# Patient Record
Sex: Male | Born: 1960 | Race: White | Hispanic: No | Marital: Married
Health system: Midwestern US, Community
[De-identification: ages and names within clinical notes are randomized; demographics above are authoritative.]

## PROBLEM LIST (undated history)

## (undated) DIAGNOSIS — M19012 Primary osteoarthritis, left shoulder: Secondary | ICD-10-CM

## (undated) DIAGNOSIS — Z125 Encounter for screening for malignant neoplasm of prostate: Secondary | ICD-10-CM

## (undated) DIAGNOSIS — Z114 Encounter for screening for human immunodeficiency virus [HIV]: Secondary | ICD-10-CM

## (undated) DIAGNOSIS — K429 Umbilical hernia without obstruction or gangrene: Secondary | ICD-10-CM

## (undated) DIAGNOSIS — E782 Mixed hyperlipidemia: Principal | ICD-10-CM

## (undated) DIAGNOSIS — E039 Hypothyroidism, unspecified: Principal | ICD-10-CM

## (undated) DIAGNOSIS — C61 Malignant neoplasm of prostate: Principal | ICD-10-CM

## (undated) DIAGNOSIS — I10 Essential (primary) hypertension: Principal | ICD-10-CM

---

## 2009-10-14 MED FILL — ONDANSETRON (PF) 4 MG/2 ML INJECTION: 4 mg/2 mL | INTRAMUSCULAR | Qty: 2

## 2009-10-14 MED FILL — FENTANYL CITRATE (PF) 50 MCG/ML IJ SOLN: 50 mcg/mL | INTRAMUSCULAR | Qty: 2

## 2011-12-01 NOTE — Progress Notes (Signed)
Physical Therapy Evaluation and Plan of Care    Patient Name: Jay Lee  Date:12/01/2011  DOB: Jul 22, 1961  Payor: Payor: BLUE CROSS  [place]: [facility] [place] [place] [facility]  Product Type: PPO    Diagnosis: Shoulder pain [719.41]  Illene Bolus, MD    Dictated 12/01/11 Reviewed 12/02/11     PHYSICAL THERAPY INITIAL EVALUATION    SUBJECTIVE:  This is a 51 year old male with a chief complaint of right elbow pain, stiffness, and weakness.  He reports slipping falling on an outstretched elbow in middle of February.  He reports he underwent a surgical repair ???of ligaments??? and bone removal approximately two weeks ago.  He neglected to bring his referral from the surgeon.    CURRENT SYMPTOMS:  He complains of discomfort around the elbow, which can disturb his sleep at night.  He complains of limited extension of the elbow and relative weakness.    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  He has returned to full duty work with some adjustments.  He does ask for help lifting tyres.  He has returned to operating and pushing a power tool.    PREVIOUS MEDICAL HISTORY:  He fractured his right ulna when he was 51 years old and this was treated with open reduction and internal fixation and a tendon transfer.    He had right total knee replacement in 2006.  He reports he is in otherwise good health.  His hobby is racing stock cars.    OBJECTIVE:  Posture in relaxed stance, he presents with good muscular development to the cervical spine, scapular region, and arms.  In relaxed stance, the right arm appears shorter than the left.  There is a surgical scar on the posterior aspect of the right elbow.  There is increased skin temperature about the surgery, no skin discoloration, nor obvious signs of infection, and mild amount of swelling.    Active elbow flexion is within normal limits, symmetric, and symptom free.  Active pronation and supination is within normal limits, symmetric, and symptom free.  Active and passive  right elbow extension is approximately minus 5 degrees.  Passive right elbow extension, the patient reports limitation of movement from the dorsal aspect of the elbow and stretching sensation in the volar aspects.    Active stretch of the right triceps (shoulder) flexion and elbow flexion is mildly limited on the right in comparison to the left.    TREATMENT:  Today, he was counseled to be more conservative and protective of the elbow.  He was encouraged to purchase an elbow sleeve or pad to wear while working as an Journalist, newspaper.  He was counseled regarding the necessary time for healing of the injured tissues that have been reconstructed.  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  Today, he was treated with local application of ice for 10 minutes.  He was encouraged to use local application of ice one to two times a day.  We discussed the risk of doing too much, too fast, and too soon.    ASSESSMENT:  The patient presents with right elbow pain, status post surgical procedure.  Clinical examination is significant for mild limitation of elbow extension and mild-to-moderate amount of possible joint effusion.    INITIAL WORKING HYPOTHESIS:  If he approaches his rehabilitation and recovery in a more conservative manner, we should be able to prevent disruption of the surgical repair and facilitate the  healing process.  After appropriate time for tissue repair, a progressive strengthening program for elbow extension is indicated.    INITIAL TREATMENT GOAL:  1. To decrease pain and decrease swelling about elbow within one to two weeks.  2. To demonstrate normal range of motion in strength for right elbow extension in four to eight weeks.    PLAN:  He will be seen once a week for the next three to four weeks for reassessment, additional instruction, self-management techniques, and progression of remedial exercises.    Alta Corning PT, DPT, OCS      PT License # [id number]    Compliance  data:  Treatment area: Shoulder pain [719.41]  Patient date of birth verified, yes  Medication log reviewed and reconciled completed  Known adverse and allergic reacations reviewed completed  Prior hospitalization: none  Prior level of function: see history below  Patient / Family readiness to learn indicated by: asking questions, trying to perform skills and interest  Persons(s) to be included in education:   patient (P)  Barriers to Learning/Limitations: no  Domestic abuse: no evidence  Fall risk assessment: no evidence  Suicide risk assessment: reviewed and assessed no evidence  Pertinent medical history, systems reviewed, and co-morbid conditions: see initial dictation  Community resources were discussed with patient if indicated not indicated  Initial functional outcome measures completed DASH  Skilled Physical Therapy indicated - yes  Patient/ Caregiver education and instruction: self care, activity modification and brace/ splint application  Rehabilitation Potential: excellent  Pain level pre-treatment: 5  Pain level post-treatment: 3  Patient goals: "get back normal"    In time:2:02 PM  Out time:2:45  Total Treatment Time: 43  Total Timed Codes: 20  Evaluation time: 18  Modality 15 minutes  Patient Self Care Home Management 20

## 2011-12-08 NOTE — Progress Notes (Signed)
PHYSICAL THERAPY DAILY TREATMENT NOTE    Patient Name: Jay Lee  Date:12/08/2011  DOB: 11-24-1960  [x]   Patient DOB Verified  Payor: Payor: BLUE CROSS  Plan: [facility] [place] [facility]  Product Type: PPO    Referral Source: Illene Bolus, MD    Treatment Area: Shoulder pain [719.41]    SUBJECTIVE  Pain Level (0-10 scale): 0  He reports he is pain free.  He reports he has been able to work and that he has been cautious.      Any medication changes, allergies to medications, adverse drug reactions, diagnosis change, or new procedure performed?: [x]  No    []  Yes     OBJECTIVE  Therapeutic Exercise:  Active and passive elbow flexion is WNL symmetric and symptom free.  Active and passive forearm pronation supination is with in normal limits and symptom free.      Passive stretch of right triceps (elbow flexion shoulder flexion) on right is relatively stiff compared to same maneuver on left.      Passive right elbow extension is grossly with in normal limits and symptom free on right.      Resisted isometric right elbow extension is weak and painful compared to left.  There is relative atrophy of right triceps compared to left.      He was instructed in light progressive resistive elbow extension (blue theraband) on right.      Manual Therapy:   The right surgical scar is closed.  There is mild adherence of surgical scar to underlying soft tissue.  The surgical scar was treated with soft tissue massage.      Pain Level (0-10 scale) post treatment: 0    ASSESSMENT  He is approximately 3 weeks post surgery, good range of motion.  There is relative weakness.  He has complied with recommended treatment.      PLAN  Await recommendation from surgeon whether additional PT sessions are indicated or not.    Alta Corning, PT, DPT, OC  PT License # [id number]    In time:2:58 PM  Out time:3:25  Total Treatment Time (min): 27  Total Timed Codes (min): 27  Time for Therapeutic Exercise: 18  Time for Manual Therapy: 9

## 2012-02-11 NOTE — Progress Notes (Deleted)
[facility]  [address], [place], [place] [address]  Phone: [phone]  Fax: [phone]    Discharge Summary    Name: Jay Lee   DOB: [birth year]   MD: Illene Bolus, MD       Treatment Diagnosis: Shoulder pain [719.41]  Start of Care: 12/01/11    Visits from Start of Care: 2  Missed Visits: 0    Summary of Care: Therapeutic exercise, Therapeutic activities, Physical agent/modality and Patient education    Assessment / Recommendations:   Discontinue therapy. Progressing towards or have reached established goals.    Alta Corning, PT PT License # [id number]  02/11/2012

## 2012-02-21 NOTE — Progress Notes (Signed)
PHYSICAL THERAPY DAILY TREATMENT NOTE    Patient Name: Jay Lee  Date:02/21/2012  DOB: 11-27-1960  [x]   Patient DOB Verified  Payor: Payor: BLUE CROSS  Plan: [facility] [place] [place] [facility]  Product Type: PPO    Referral Source: Illene Bolus, MD    Treatment Area: Shoulder pain [719.41]    SUBJECTIVE  Pain Level (0-10 scale): 0  Reports tripping in April hearing a pop and the elbow swelled.     Now c/o episodic catch sore spot on posterior aspect of elbow.      Patient self reported Quick Dash Functional Outcome score is 20% disability.  He notes difficulty throwing a football.  He has difficulty crawling and supporting himself with right arm.  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      Any medication changes, allergies to medications, adverse drug reactions, diagnosis change, or new procedure performed?: [x]  No    []  Yes     OBJECTIVE  Therapeutic Exercise:  Max grip strength is 82 lbs on left and 76 lbs on right, he is right hand dominant.      There is visible atrophy of right triceps compared to left.  He was able to perform multiple elbow extensions (triceps free weight lift) with 10 lbs on left, but could only handle 2.5 lbs on right.      He was instructed to begin daily triceps progressive strengthening with 2.5 lbs.      He was instructed to practice isometric elbow extension exercises frequently throughout the day.    Therapeutic Activity:   When in hands and knees position supporting himself with just the right am it elicited right elbow strain.      Pain Level (0-10 scale) post treatment: 0    ASSESSMENT  Three plus months s/p elbow reconstruction there is residual weakness in elbow extension on right.      The new treatment goal is to demonstrate improved strength in elbow extension in 3 to 4 weeks.      PLAN    Alta Corning, PT, DPT, OC  PT License # [id number]    In time:5 PM  Out time:5:30  Total Treatment Time (min): 30  Total Timed Codes (min): 30  Time for Therapeutic  Exercise: 30

## 2012-02-23 NOTE — Progress Notes (Signed)
PHYSICAL THERAPY DAILY TREATMENT NOTE    Patient Name: ULYSEES ROBARTS  Date:02/23/2012  DOB: 06-Dec-1960  [x]   Patient DOB Verified  Payor: Payor: BLUE CROSS  Plan: [facility]UE [place] [place] [facility]  Product Type: PPO    Referral Source: Illene Bolus, MD    Treatment Area: Shoulder pain [719.41]    SUBJECTIVE  Pain Level (0-10 scale): 0  He has occasional soreness    Any medication changes, allergies to medications, adverse drug reactions, diagnosis change, or new procedure performed?: [x]  No    []  Yes     OBJECTIVE  Therapeutic Exercise:  2.5 lbs 14 reps  5 lbs 7 reps 4 sets    Hands knees push ups 7 reps 5 sets      Pain Level (0-10 scale) post treatment: 0    ASSESSMENT  He is demonstrating improved strengthen    PLAN  Continue plan of care    Alta Corning, PT, DPT, OC  PT License # [id number]    In time:5:00 PM  Out time:5:15  Total Treatment Time (min): 15  Total Timed Codes (min): 15  Time for Therapeutic Exercise: 15

## 2012-03-20 NOTE — Progress Notes (Signed)
PHYSICAL THERAPY DAILY TREATMENT NOTE    Patient Name: Jay Lee  Date:03/20/2012  DOB: 25-Dec-1960  [x]   Patient DOB Verified  Payor: Payor: BLUE CROSS  Plan: [facility] [place] [place] [facility]  Product Type: PPO    Referral Source: Illene Bolus, MD    Treatment Area: Shoulder pain [719.41]    SUBJECTIVE  Pain Level (0-10 scale): 3  He is c/o increasing frequency of symptoms.  He describes a sensation that something is "sticking or tearing" on posterior aspect of the right elbow with certain movements.  He notices symptoms more with extending the elbow or pushing with the arm.  He notices the symptoms more when he tries to do remedial exercises of resistive elbow extension.  He stopped doing the remedial exercises about a week ago.      Any medication changes, allergies to medications, adverse drug reactions, diagnosis change, or new procedure performed?: [x]  No    []  Yes     OBJECTIVE  Therapeutic Exercise:  Active extension of right elbow is with in normal limits, mildly symptomatic at end range.  With active right elbow extension there appears to be greater swelling/puffiness in the ulnar humeral joint compared to the left.  Palpation of the right lateral tendonous insertion feels thinner and/or absent compared to the left.      Active elbow flexion and stretch of the right triceps is with in normal limits and symptom free.      He was instructed to discontinue strengthening exercises use local application of ice and return to surgeon for follow up.      Pain Level (0-10 scale) post treatment: 3    ASSESSMENT  Unfortunately, he is experiencing a adverse response to attempts to participate in more aggressive strengthening exercises.  Symptoms and signs suggest possible impairment of the musculotendinous insertion of the triceps.      PLAN  Hold therapy and remedial exercise until there is reassessment by surgeon.    Alta Corning, PT, DPT, OC  PT License # [id number]    In time:5:01 PM  Out time:5:15  Total  Treatment Time (min): 14  Total Timed Codes (min): 14  Time for Therapeutic Exercise: 14

## 2014-01-16 NOTE — ED Provider Notes (Signed)
HPI Comments: 53 y.o. male with past medical history significant for high cholesterol and GERD who presents from home via EMS with chief complaint of left eye pain. Pt reports he was working on a tire with a pry bar this evening and the handle hit his left eye. Pt states "next thing I know, I was on my knees". Pt also c/o "foggy" vision in both eyes. Pt denies any foreign body sensation.  There are no other acute medical concerns at this time.    Significant PSHx: multiple orthopedic surgeries  Significant FMHx: none discussed.   PCP: Rockne Coons K Crossen, MD    Note written by Roberto ScalesGloria Kubuanu, Scribe, as dictated by Christella Noaharles D Shauna Bodkins, MD 8:31 PM        The history is provided by the patient.        Past Medical History   Diagnosis Date   ??? High cholesterol    ??? GERD (gastroesophageal reflux disease)         Past Surgical History   Procedure Laterality Date   ??? Hx knee replacement Right    ??? Hx orthopaedic Right      FOREARM   ??? Hx orthopaedic Right      ELBOW         History reviewed. No pertinent family history.     History     Social History   ??? Marital Status: MARRIED     Spouse Name: N/A     Number of Children: N/A   ??? Years of Education: N/A     Occupational History   ??? Not on file.     Social History Main Topics   ??? Smoking status: Never Smoker    ??? Smokeless tobacco: Not on file   ??? Alcohol Use: Yes   ??? Drug Use: Not on file   ??? Sexual Activity: Not on file     Other Topics Concern   ??? Not on file     Social History Narrative   ??? No narrative on file                  ALLERGIES: Review of patient's allergies indicates no known allergies.      Review of Systems   Eyes: Positive for pain and visual disturbance.   All other systems reviewed and are negative.      Filed Vitals:    01/16/14 1958 01/16/14 2000   BP: 204/122 188/108   Temp: 98.4 ??F (36.9 ??C)    Resp: 22    Height: 5\' 8"  (1.727 m)    Weight: 90.719 kg (200 lb)    SpO2: 96%             Physical Exam   Constitutional: He appears well-developed and  well-nourished. No distress.   HENT:   Head: Normocephalic.   Eyes: Pupils are equal, round, and reactive to light.   Contusion on left medial sclera.   Small abrasion on left eyelid.   Anterior chambers clear with no hyphema.   No corneal injury with fluorescein slit lamp exam.   Neck: Normal range of motion.   Cardiovascular: Normal rate and regular rhythm.    No murmur heard.  Pulmonary/Chest: Effort normal and breath sounds normal. No respiratory distress.   Abdominal: Soft. There is no tenderness.   Musculoskeletal: Normal range of motion. He exhibits no edema.   Neurological: He is alert. He has normal strength. No cranial nerve deficit.   Skin: Skin is warm  and dry.   Psychiatric: He has a normal mood and affect. His behavior is normal.   Nursing note and vitals reviewed.  Note written by Roberto ScalesGloria Kubuanu, Scribe, as dictated by Christella Noaharles D Cecile Guevara, MD 8:59 PM       MDM    Procedures    CONSULT NOTE:  9:19 PM Christella Noaharles D Shanikia Kernodle, MD spoke with Dr. Yisroel Rammingarothers, Consult for Ophthalmology. Discussed available diagnostic tests and clinical findings. Provider is in agreement with care plans as outlined. Provider will see pt tomorrow afternoon.  Note written by Roberto ScalesGloria Kubuanu, Scribe, as dictated by Christella Noaharles D Kinsey Cowsert, MD 9:19 PM      Assessment and plan           the patient has a scleral contusion but no sign of corneal injury or hyphema. I spoke with Dr. Carole Binningaruthers who will see the patient tomorrow for further followup at [place]. The patient's visual acuity is 25/20 in both eyes.Christella Noaharles D Azael Ragain, MD 9:39 PM

## 2014-01-16 NOTE — ED Notes (Signed)
TRIAGE: Patient arrives from home, escorted by male companion, for LEFT eye injury. Patient has approximate 1/4" laceration to LEFT upper eyelid. Patient has redness to LEFT sclera. Patient states he was working on a tire with a crowbar when a screwdriver popped up and injured his eye. He denies any loss of consciousness.

## 2014-01-16 NOTE — ED Notes (Signed)
Pt given discharge instructions. Questions answered and pt states understanding, no distress noted, pt ambulated out of unit.

## 2014-01-17 MED ADMIN — tetracaine HCl (PF) (PONTOCAINE) 0.5 % ophthalmic solution 2 Drop: OPHTHALMIC | @ 01:00:00 | NDC 00065074112

## 2014-01-17 MED ADMIN — tobramycin (TOBREX) 0.3 % ophthalmic ointment: OPHTHALMIC | @ 02:00:00 | NDC 00065064435

## 2014-01-17 MED ADMIN — fluorescein (FUL-GLO) 1 mg ophthalmic strip 1 Strip: OPHTHALMIC | @ 01:00:00 | NDC 17478040401

## 2014-01-17 MED FILL — TOBREX 0.3 % EYE OINTMENT: 0.3 % | OPHTHALMIC | Qty: 3.5

## 2014-01-17 MED FILL — TETRACAINE HCL (PF) 0.5 % EYE DROPS: 0.5 % | OPHTHALMIC | Qty: 2

## 2014-01-17 MED FILL — BIOGLO 1 MG EYE STRIPS: 1 mg | OPHTHALMIC | Qty: 1

## 2019-01-31 ENCOUNTER — Encounter

## 2019-02-08 ENCOUNTER — Inpatient Hospital Stay: Admit: 2019-02-08 | Payer: BLUE CROSS/BLUE SHIELD | Attending: Orthopaedic Surgery | Primary: Internal Medicine

## 2019-02-08 DIAGNOSIS — M19012 Primary osteoarthritis, left shoulder: Secondary | ICD-10-CM

## 2019-02-21 IMAGING — DX XR KNEE 2 VIEWS RIGHT
1 series · 2 of 2 positions shown · non-contrast
Comparison: none

RIGHT KNEE RADIOGRAPH
INDICATION: post op

[Series 5775: AP · right · 2 of 2 slices shown]
[im 1/2]
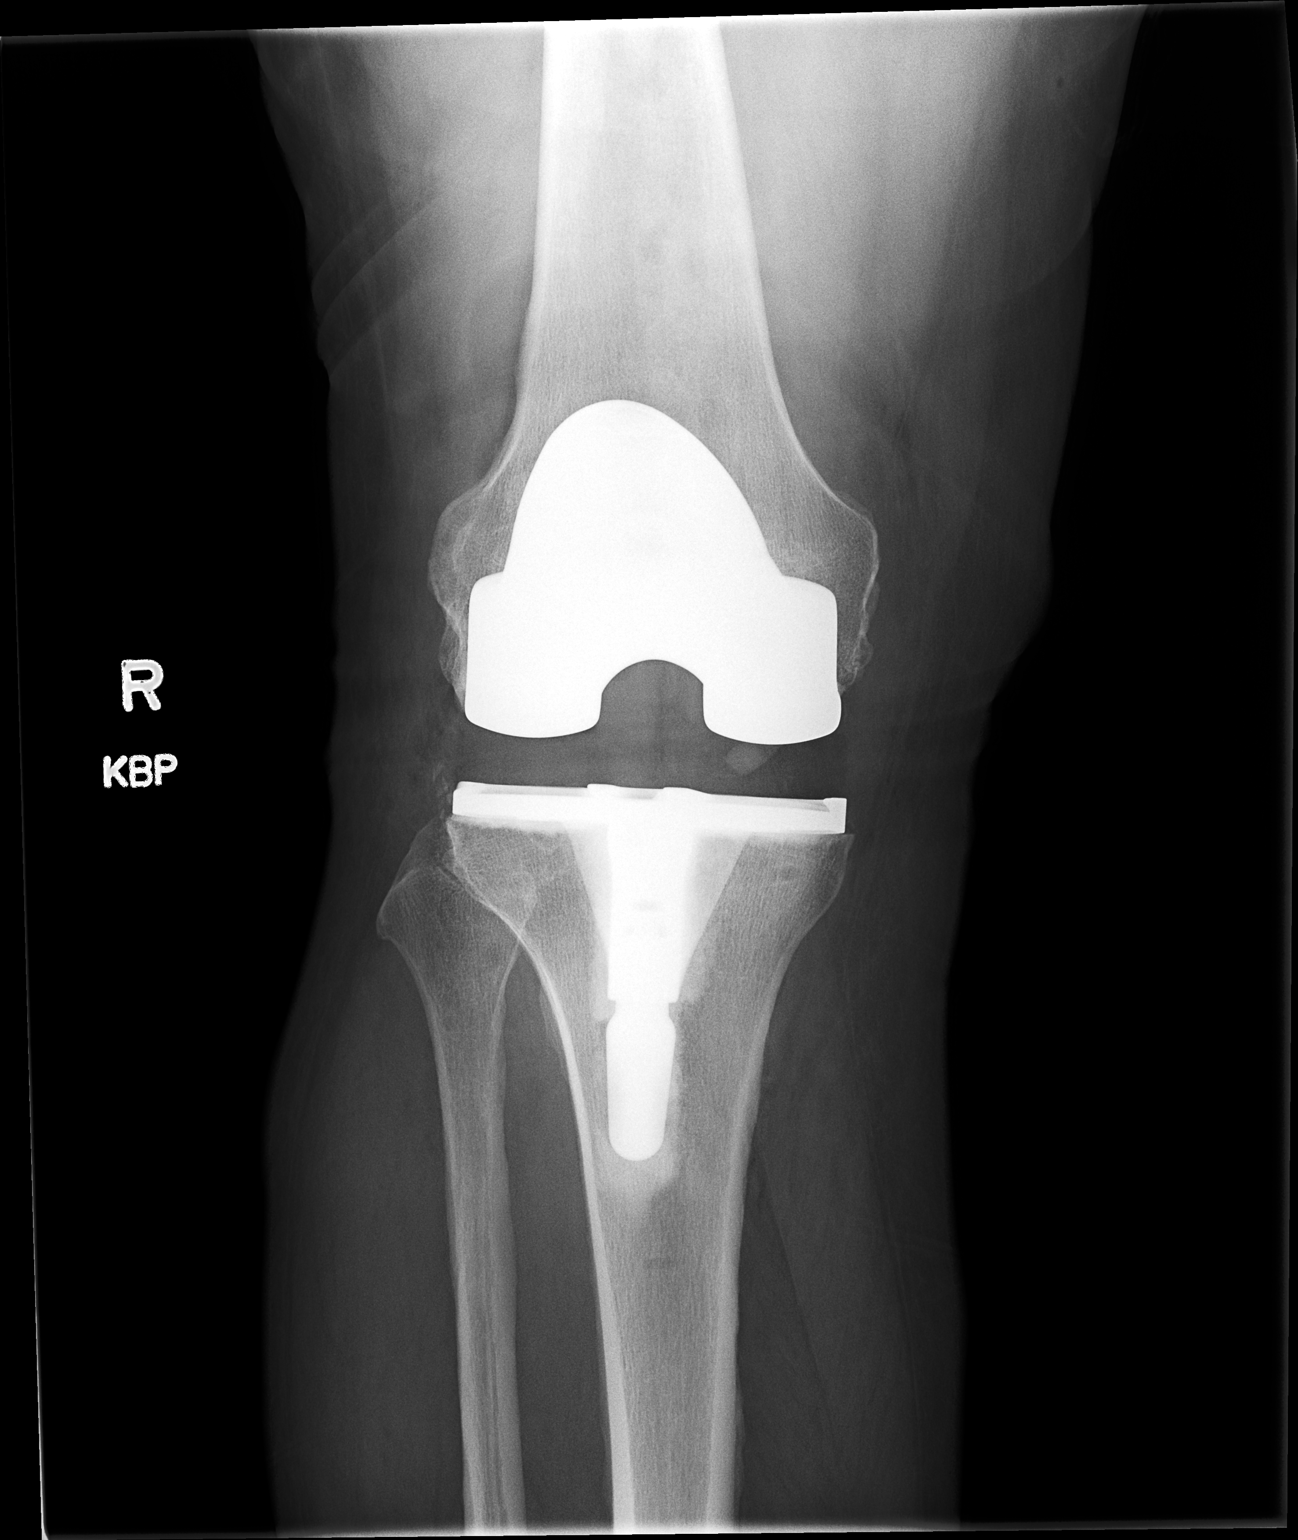
[im 2/2]
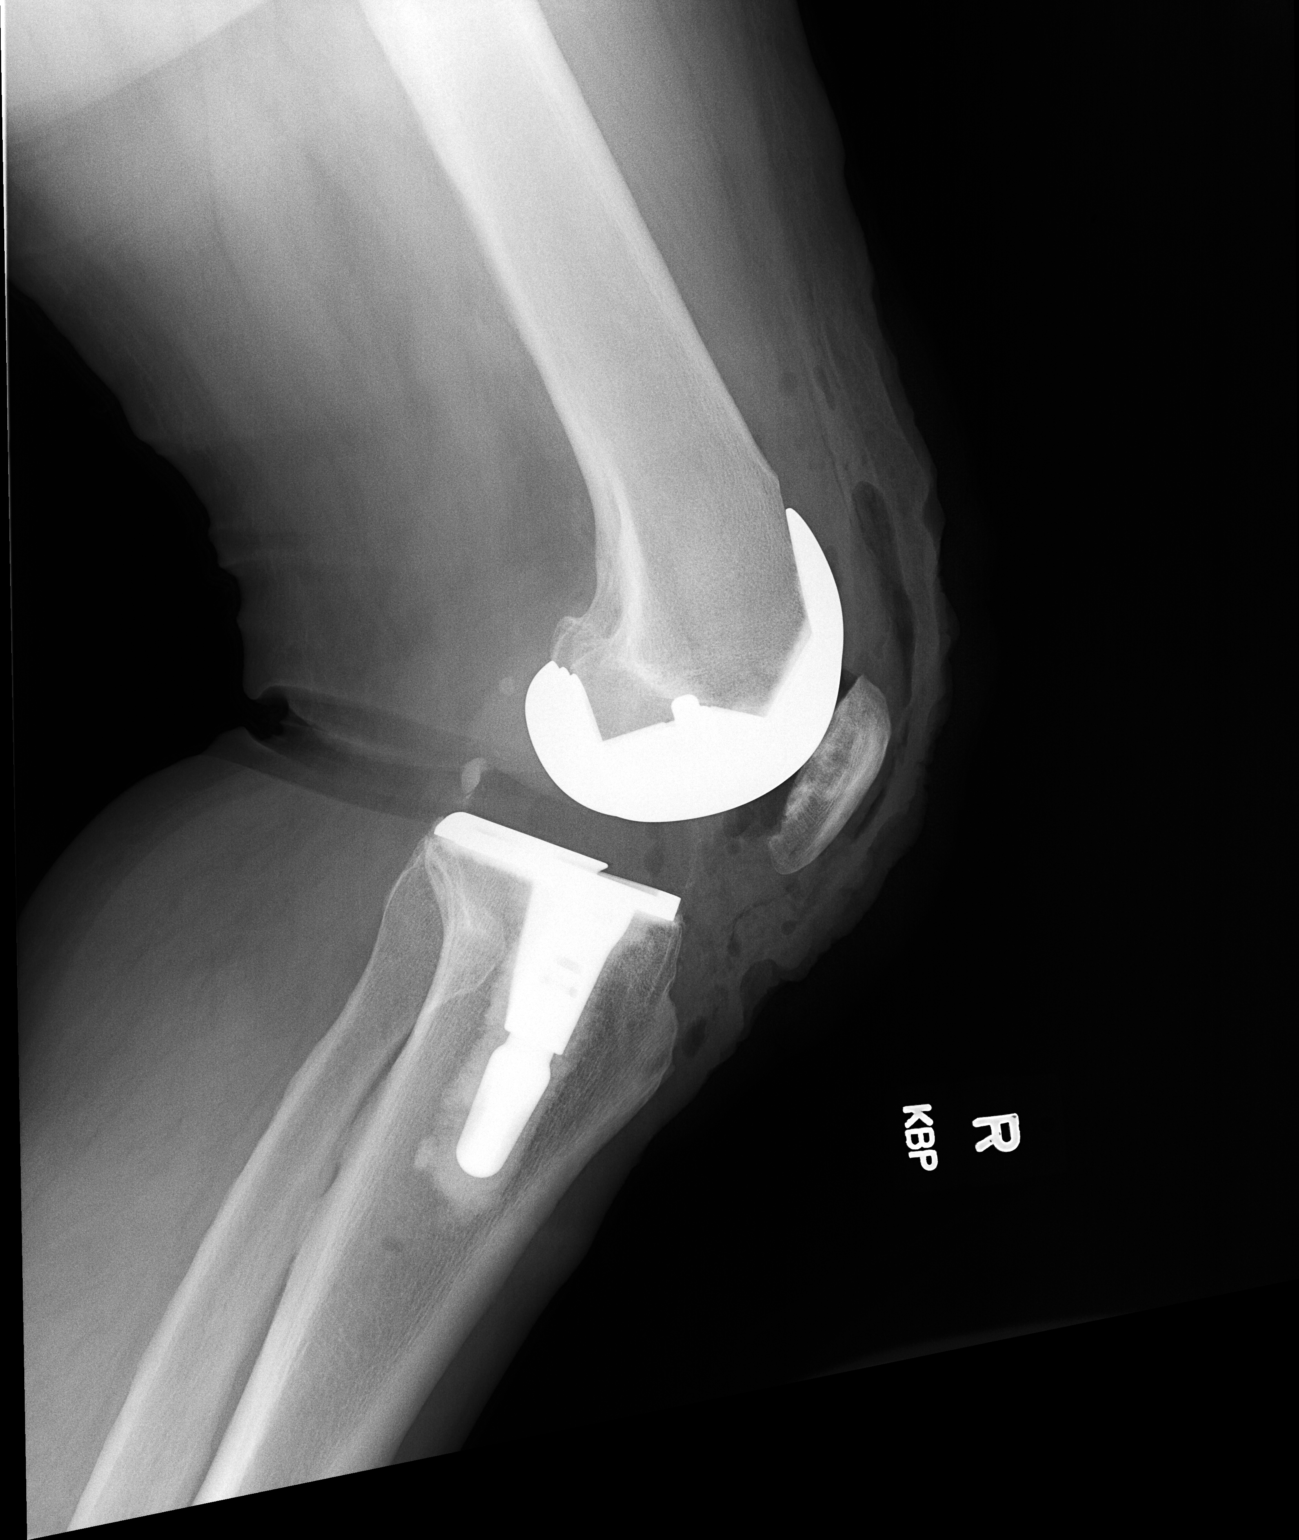

[2 of 2 positions shown; findings below may reference images not displayed]

FINDINGS: AP and lateral views of the right knee show the patient has undergone total right knee arthroplasty. The implant components appear well seated with appropriate alignment. No acute fracture. Expected postoperative appearance of the soft tissues.
IMPRESSION: Status post total right knee arthroplasty.
Normal alignment.
No acute fracture.
Location code: 1

## 2019-03-19 IMAGING — DX XR KNEE 2 VIEWS RIGHT
1 series · 2 of 2 positions shown · non-contrast
Comparison: none

POST OP KNEE SURGERY
EXAM: RIGHT KNEE:
CLINICAL INDICATION: Right Total Knee
REFERENCE: 02/21/2019

[Series 2160: AP · right · 2 of 2 slices shown]
[im 1/2]
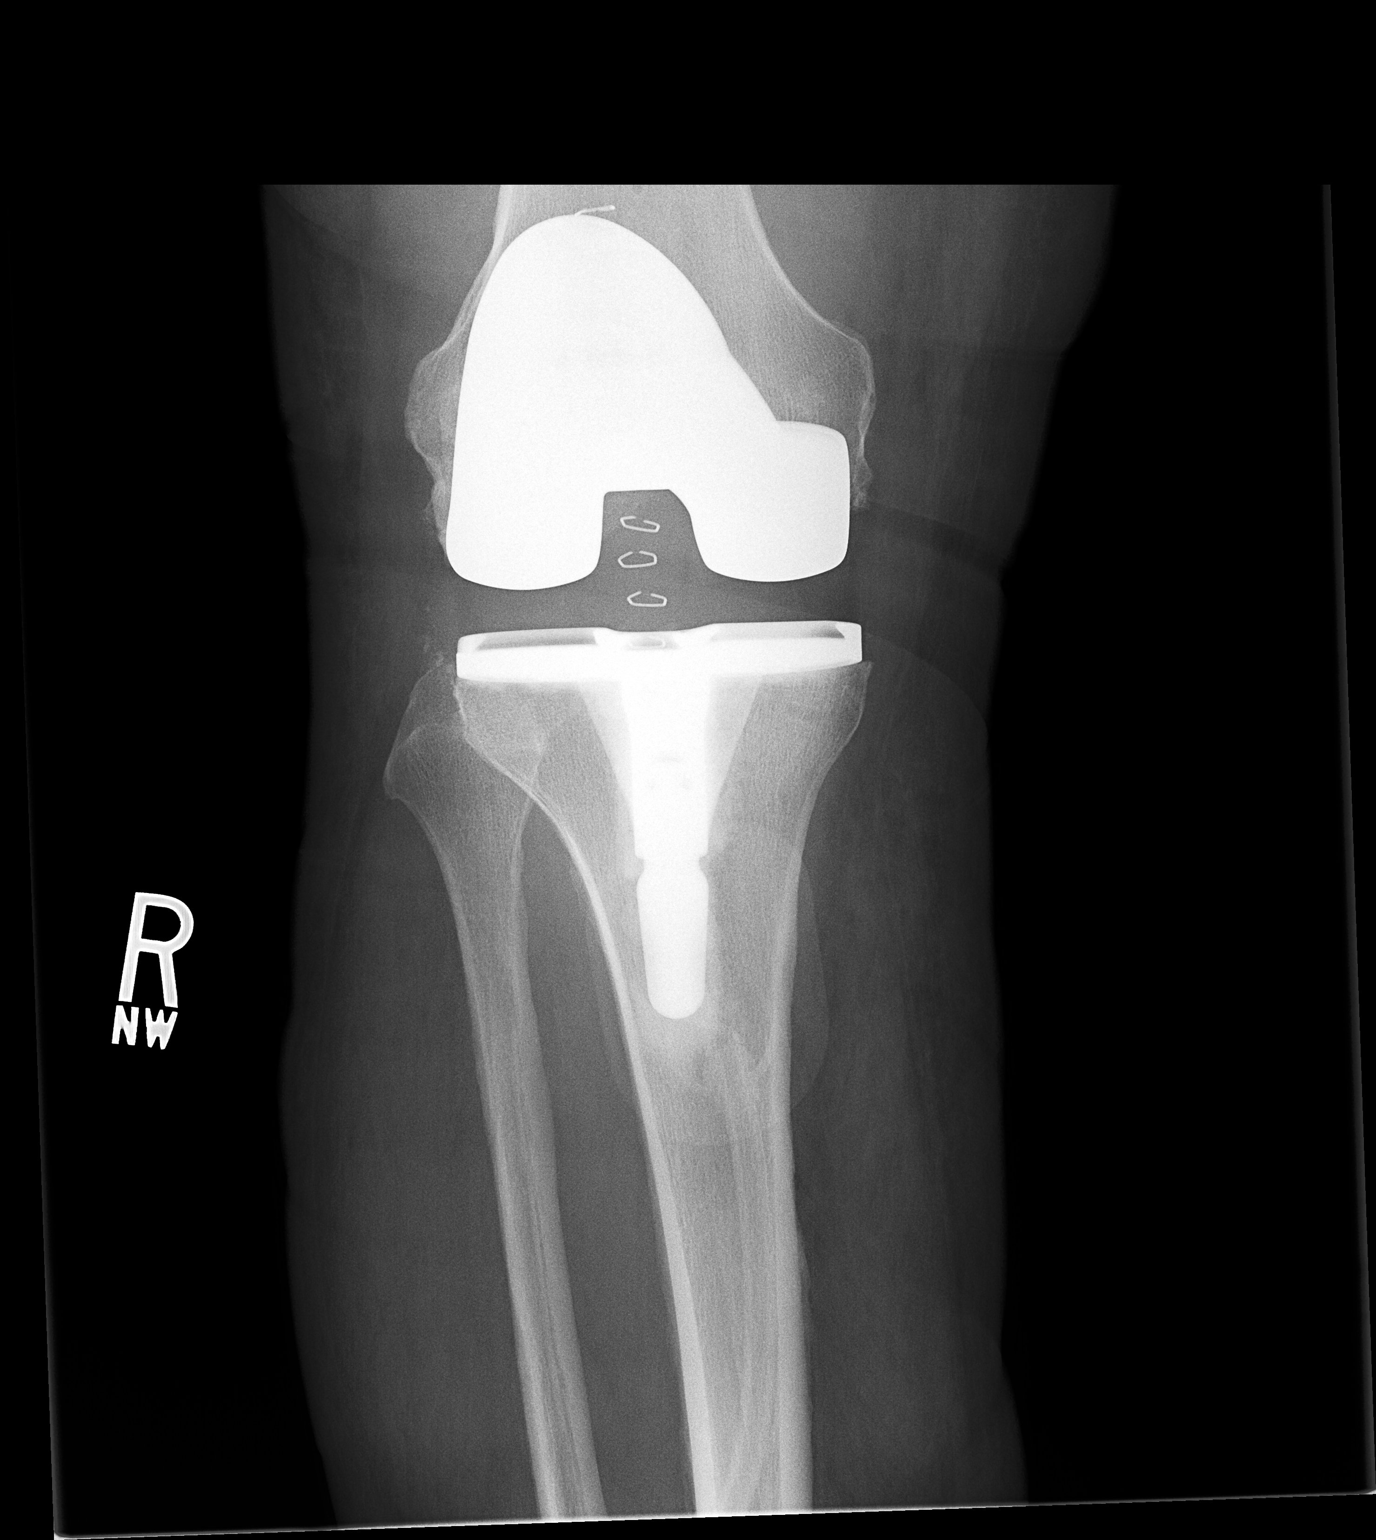
[im 2/2]
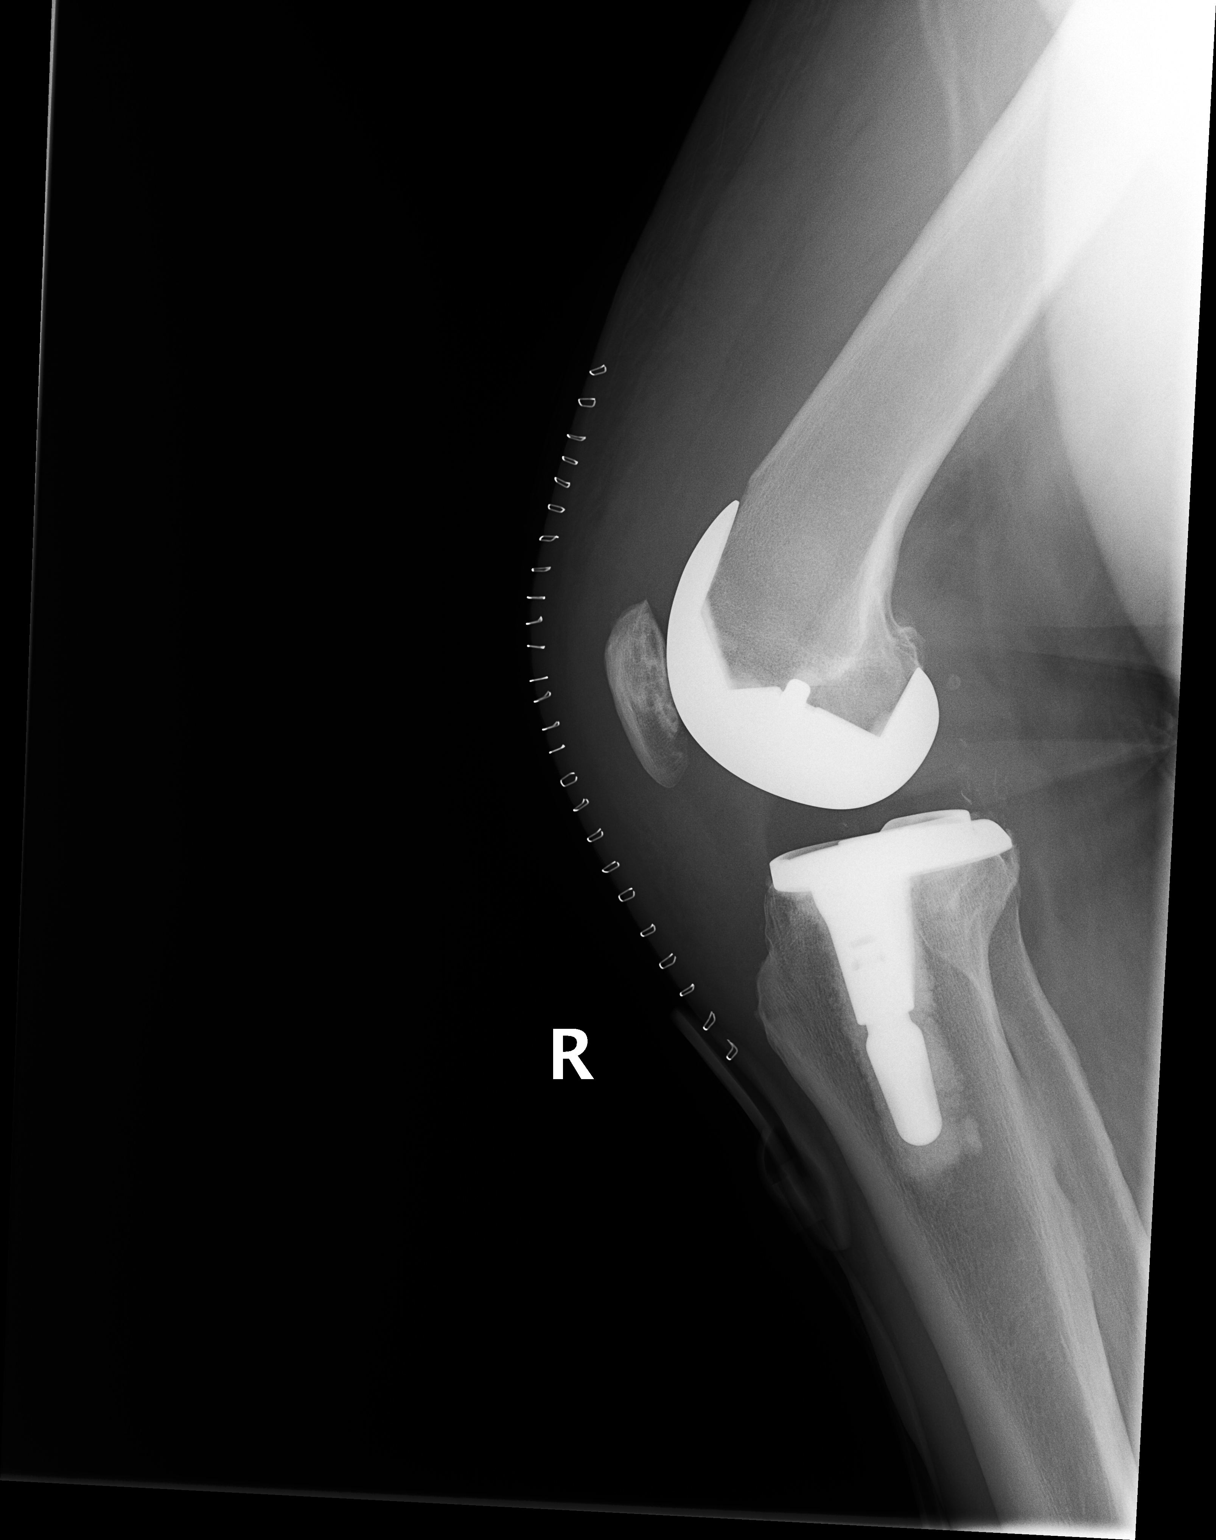

[2 of 2 positions shown; findings below may reference images not displayed]

FINDINGS: 2 Radiographs of the right knee demonstrate:
normally aligned and intact osseous structures.
No acute fracture or dislocation is evident.
Total arthroplasty changes are present with satisfactory alignment.
Subcutaneous and intra-articular emphysema is present with overlying soft tissue thickening.
IMPRESSION: Expected post surgical changes with normal alignment.  No immediate complication is evident.
LOCATION CODE : 1

## 2019-04-16 IMAGING — CR XR CHEST 1 VIEW
1 series · 1 of 1 positions shown · non-contrast
Comparison: None

Hypotension. nausea
Exam:Chest x-ray single view.
Location number is 9

[AP]
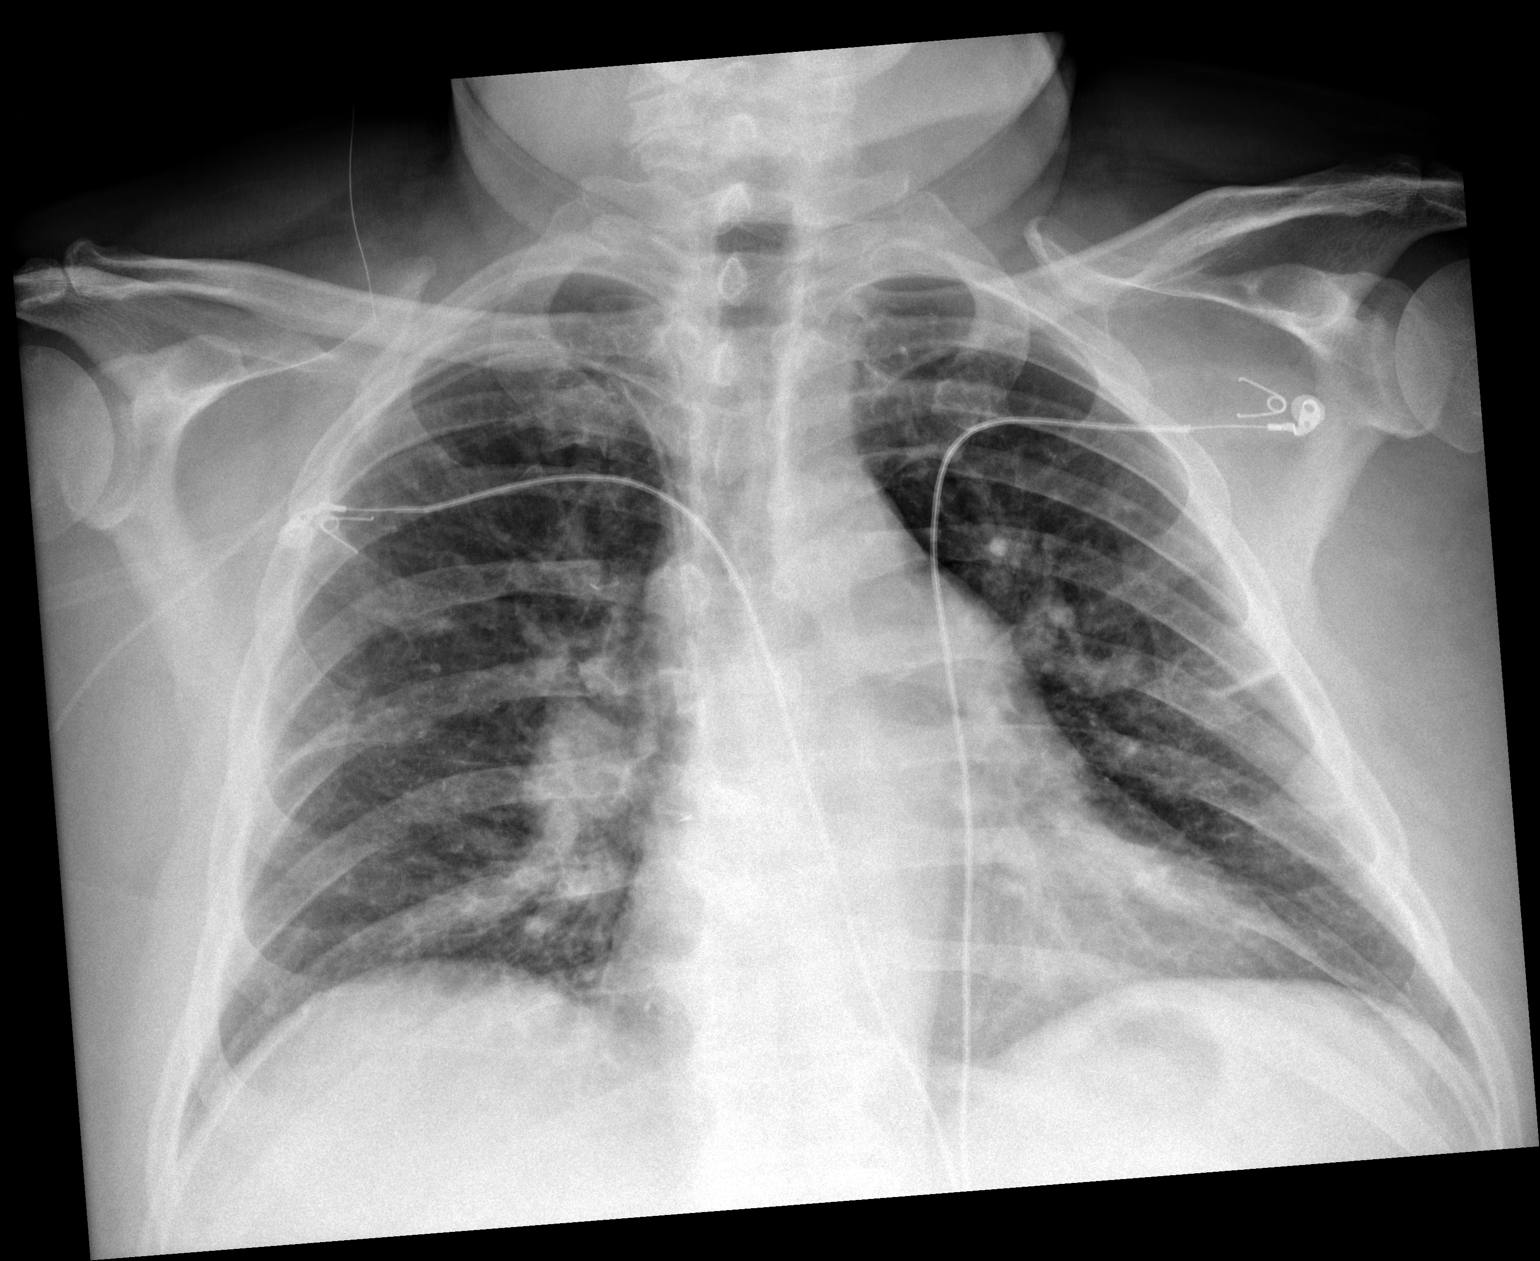

[1 of 1 positions shown; findings below may reference images not displayed]

FINDINGS: Heart: Normal size.
Pleural effusion: No pleural effusion present.
Lungs: There are circumscribed linear focus of increased density lateral aspect right left lungs suggesting scarring. This mild extent. Lungs are otherwise clear.
Bones: Bones are intact.
Visualized portions of abdomen: Visualized portions of abdomen appear unremarkable.
PICC line is demonstrated with tip at expected position of the cavoatrial junction.
IMPRESSION: 
IMPRESSION: No acute cardiopulmonary abnormality.

## 2020-01-17 ENCOUNTER — Ambulatory Visit: Attending: Internal Medicine | Primary: Internal Medicine

## 2020-01-17 ENCOUNTER — Ambulatory Visit
Admit: 2020-01-17 | Discharge: 2020-01-17 | Payer: BLUE CROSS/BLUE SHIELD | Attending: Internal Medicine | Primary: Internal Medicine

## 2020-01-17 DIAGNOSIS — Z0000 Encounter for general adult medical examination without abnormal findings: Secondary | ICD-10-CM

## 2020-01-17 NOTE — Progress Notes (Signed)
See letter

## 2020-01-17 NOTE — Progress Notes (Signed)
HISTORY OF PRESENT ILLNESS  Jay Lee is a 59 y.o. male.  HPI  Assessment.  Jay Lee is seen today for an introductory visit upon the kind referral of his mother Pat who has come to my practice for many years. I have met him at some of her visits in the past. I agreed to become his primary care doctor.     Preventive Medicine. He is due for a physical. He is also due for routine labs, shingles vaccine and colonoscopy. He is up to date with COVID vaccine and Tdap booster. I will put a request in for medical records from his former primary care physician, Dr. Crossen.    Chronic Problems Reviewed.   1. He has chronic gastroesophageal reflux disease that is stable with medication treatment.   2. He has a history of elevated triglycerides. He has been on fenofibrate. He apparently has never required a statin for treatment.   3. Lumbar disc disease, osteoarthritis, stable. He treats these symptomatically and will see an orthopedist on a prn basis.   4. Elevated blood pressure with a history of white coat syndrome. I have asked him to check some outside readings and let me know how these are.  5. Colon polyp. As noted above, he is due for a colonoscopy.     Social History. Notable for going by [name]. He is married, no children. 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     Past Medical History:   Diagnosis Date   ??? GERD (gastroesophageal reflux disease)    ??? High cholesterol      Past Surgical History:   Procedure Laterality Date   ??? HX KNEE REPLACEMENT Right    ??? HX ORTHOPAEDIC Right     FOREARM   ??? HX ORTHOPAEDIC Right     ELBOW   ??? HX ORTHOPAEDIC Left     shoulder replacement   ??? HX ORTHOPAEDIC Right     rotator cuff     Current Outpatient Medications   Medication Sig   ??? omeprazole (PRILOSEC) 20 mg capsule Take 20 mg by mouth daily.   ??? fenofibrate (LOFIBRA) 160 mg tablet Take 160 mg by mouth daily.   ???  aspirin (ASPIRIN) 325 mg tablet Take 325 mg by mouth daily.   ??? flaxseed oil 1,000 mg cap Take  by mouth daily.   ??? multivitamin (ONE A DAY) tablet Take 1 Tablet by mouth daily.   ??? cyanocobalamin, vitamin B-12, 2,000 mcg tab Take  by mouth daily.   ??? OTHER daily. Calcium-Magnesium-Zinc   ??? OTHER 2,400 mcg daily. Vit A     No current facility-administered medications for this visit.     No Known Allergies  Family History   Problem Relation Age of Onset   ??? Hypertension Mother    ??? Arthritis-osteo Mother    ??? Diabetes Mother    ??? Elevated Lipids Mother    ??? Lung Disease Father    ??? Arthritis-osteo Brother    ??? Heart Disease Brother        Review of Systems   Constitutional: Negative for chills, fever and weight loss.   Respiratory: Negative.    Cardiovascular: Negative for chest pain, palpitations, leg swelling and PND.   Gastrointestinal: Negative.    Genitourinary: Negative.    Musculoskeletal: Positive for back pain and joint pain. Negative for myalgias.     Neurological: Negative for focal weakness.       Physical Exam  Vitals and nursing note reviewed.   Constitutional:       General: He is not in acute distress.     Appearance: He is well-developed.   HENT:      Head: Normocephalic and atraumatic.      Right Ear: Tympanic membrane, ear canal and external ear normal.      Left Ear: Tympanic membrane, ear canal and external ear normal.   Eyes:      General:         Right eye: No discharge.         Left eye: No discharge.      Pupils: Pupils are equal, round, and reactive to light.   Neck:      Thyroid: No thyromegaly.      Vascular: No carotid bruit.   Cardiovascular:      Rate and Rhythm: Normal rate and regular rhythm.      Heart sounds: Normal heart sounds. No murmur heard.   No friction rub. No gallop.    Pulmonary:      Effort: Pulmonary effort is normal. No respiratory distress.      Breath sounds: Normal breath sounds. No wheezing or rales.   Abdominal:      General: Bowel sounds are normal. There is no  distension.      Palpations: Abdomen is soft. There is no mass.      Tenderness: There is no abdominal tenderness. There is no guarding or rebound.   Musculoskeletal:         General: No tenderness. Normal range of motion.      Cervical back: Normal range of motion and neck supple.   Lymphadenopathy:      Cervical: No cervical adenopathy.   Skin:     General: Skin is warm and dry.      Findings: No rash.   Neurological:      Mental Status: He is alert and oriented to person, place, and time.      Deep Tendon Reflexes: Reflexes are normal and symmetric.   Psychiatric:         Behavior: Behavior normal.         ASSESSMENT and PLAN  Diagnoses and all orders for this visit:    1. Routine general medical examination at a health care facility  -     CBC WITH AUTOMATED DIFF; Future  -     METABOLIC PANEL, COMPREHENSIVE; Future  -     LIPID PANEL; Future  -     TSH 3RD GENERATION; Future  -     URINALYSIS W/ RFLX MICROSCOPIC; Future    2. Screening for prostate cancer  -     PSA SCREENING (SCREENING); Future    3. Screen for colon cancer  -     REFERRAL TO GASTROENTEROLOGY

## 2020-01-17 NOTE — Progress Notes (Signed)
Add on thyroid peroxidase antibody. Dx= abnormal thyroid function

## 2020-01-17 NOTE — Progress Notes (Signed)
Called [facility] - spoke with Kesha. Added on thyroid peroxidase antibody. Dx abnormal thyroid function. Will forward to MD.

## 2020-01-17 NOTE — Progress Notes (Signed)
[facility] - spoke with [facility]. Added on thyroid peroxidase antibody. Dx abnormal thyroid function. Will forward to MD.

## 2020-01-17 NOTE — Progress Notes (Signed)
HISTORY OF PRESENT ILLNESS  Jay Lee is a 59 y.o. male.  HPI  Assessment.  Mr. Jay Lee is seen today for an introductory visit upon the kind referral of his mother Jay Lee who has come to my practice for many years. I have met him at some of her visits in the past. I agreed to become his primary care doctor.     Preventive Medicine. He is due for a physical. He is also due for routine labs, shingles vaccine and colonoscopy. He is up to date with COVID vaccine and Tdap booster. I will put a request in for medical records from his former primary care physician, Dr. Gaylan Gerold.    Chronic Problems Reviewed.   1. He has chronic gastroesophageal reflux disease that is stable with medication treatment.   2. He has a history of elevated triglycerides. He has been on fenofibrate. He apparently has never required a statin for treatment.   3. Lumbar disc disease, osteoarthritis, stable. He treats these symptomatically and will see an orthopedist on a prn basis.   4. Elevated blood pressure with a history of white coat syndrome. I have asked him to check some outside readings and let me know how these are.  5. Colon polyp. As noted above, he is due for a colonoscopy.     Social History. Notable for going by [facility]. He is married, no children. 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     Past Medical History:   Diagnosis Date   ??? GERD (gastroesophageal reflux disease)    ??? High cholesterol      Past Surgical History:   Procedure Laterality Date   ??? HX KNEE REPLACEMENT Right    ??? HX ORTHOPAEDIC Right     FOREARM   ??? HX ORTHOPAEDIC Right     ELBOW   ??? HX ORTHOPAEDIC Left     shoulder replacement   ??? HX ORTHOPAEDIC Right     rotator cuff     Current Outpatient Medications   Medication Sig   ??? omeprazole (PRILOSEC) 20 mg capsule Take 20 mg by mouth daily.   ??? fenofibrate (LOFIBRA) 160 mg tablet Take 160 mg by mouth daily.   ???  aspirin (ASPIRIN) 325 mg tablet Take 325 mg by mouth daily.   ??? flaxseed oil 1,000 mg cap Take  by mouth daily.   ??? multivitamin (ONE A DAY) tablet Take 1 Tablet by mouth daily.   ??? cyanocobalamin, vitamin B-12, 2,000 mcg tab Take  by mouth daily.   ??? OTHER daily. Calcium-Magnesium-Zinc   ??? OTHER 2,400 mcg daily. Vit A     No current facility-administered medications for this visit.     No Known Allergies  Family History   Problem Relation Age of Onset   ??? Hypertension Mother    ??? Arthritis-osteo Mother    ??? Diabetes Mother    ??? Elevated Lipids Mother    ??? Lung Disease Father    ??? Arthritis-osteo Brother    ??? Heart Disease Brother        Review of Systems   Constitutional: Negative for chills, fever and weight loss.   Respiratory: Negative.    Cardiovascular: Negative for chest pain, palpitations, leg swelling and PND.   Gastrointestinal: Negative.    Genitourinary: Negative.    Musculoskeletal: Positive for back pain and joint pain. Negative for myalgias.  Neurological: Negative for focal weakness.       Physical Exam  Vitals and nursing note reviewed.   Constitutional:       General: He is not in acute distress.     Appearance: He is well-developed.   HENT:      Head: Normocephalic and atraumatic.      Right Ear: Tympanic membrane, ear canal and external ear normal.      Left Ear: Tympanic membrane, ear canal and external ear normal.   Eyes:      General:         Right eye: No discharge.         Left eye: No discharge.      Pupils: Pupils are equal, round, and reactive to light.   Neck:      Thyroid: No thyromegaly.      Vascular: No carotid bruit.   Cardiovascular:      Rate and Rhythm: Normal rate and regular rhythm.      Heart sounds: Normal heart sounds. No murmur heard.   No friction rub. No gallop.    Pulmonary:      Effort: Pulmonary effort is normal. No respiratory distress.      Breath sounds: Normal breath sounds. No wheezing or rales.   Abdominal:      General: Bowel sounds are normal. There is no  distension.      Palpations: Abdomen is soft. There is no mass.      Tenderness: There is no abdominal tenderness. There is no guarding or rebound.   Musculoskeletal:         General: No tenderness. Normal range of motion.      Cervical back: Normal range of motion and neck supple.   Lymphadenopathy:      Cervical: No cervical adenopathy.   Skin:     General: Skin is warm and dry.      Findings: No rash.   Neurological:      Mental Status: He is alert and oriented to person, place, and time.      Deep Tendon Reflexes: Reflexes are normal and symmetric.   Psychiatric:         Behavior: Behavior normal.         ASSESSMENT and PLAN  Diagnoses and all orders for this visit:    1. Routine general medical examination at a health care facility  -     CBC WITH AUTOMATED DIFF; Future  -     METABOLIC PANEL, COMPREHENSIVE; Future  -     LIPID PANEL; Future  -     TSH 3RD GENERATION; Future  -     URINALYSIS W/ RFLX MICROSCOPIC; Future    2. Screening for prostate cancer  -     PSA SCREENING (SCREENING); Future    3. Screen for colon cancer  -     REFERRAL TO GASTROENTEROLOGY

## 2020-01-18 LAB — CBC WITH AUTO DIFFERENTIAL
Basophils %: 1 %
Basophils Absolute: 0.1 10*3/uL (ref 0.0–0.2)
Eosinophils %: 3 %
Eosinophils Absolute: 0.2 10*3/uL (ref 0.0–0.4)
Granulocyte Absolute Count: 0 10*3/uL (ref 0.0–0.1)
Hematocrit: 41.1 % (ref 37.5–51.0)
Hemoglobin: 13.9 g/dL (ref 13.0–17.7)
Immature Granulocytes: 0 %
Lymphocytes %: 30 %
Lymphocytes Absolute: 2 10*3/uL (ref 0.7–3.1)
MCH: 30.4 pg (ref 26.6–33.0)
MCHC: 33.8 g/dL (ref 31.5–35.7)
MCV: 90 fL (ref 79–97)
Monocytes %: 7 %
Monocytes Absolute: 0.5 10*3/uL (ref 0.1–0.9)
Neutrophils %: 59 %
Neutrophils Absolute: 4 10*3/uL (ref 1.4–7.0)
Platelets: 252 10*3/uL (ref 150–450)
RBC: 4.57 x10E6/uL (ref 4.14–5.80)
RDW: 12.7 % (ref 11.6–15.4)
WBC: 6.8 10*3/uL (ref 3.4–10.8)

## 2020-01-18 LAB — LIPID PANEL
Cholesterol, Total: 175 mg/dL (ref 100–199)
Cholesterol, total: 175 mg/dL (ref 100–199)
HDL Cholesterol: 57 mg/dL (ref >39)
HDL: 57 mg/dL (ref >39)
LDL Calculated: 107 mg/dL — ABNORMAL HIGH (ref 0–99)
LDL, calculated: 107 mg/dL — ABNORMAL HIGH (ref 0–99)
Triglyceride: 58 mg/dL (ref 0–149)
Triglycerides: 58 mg/dL (ref 0–149)
VLDL, calculated: 11 mg/dL (ref 5–40)
VLDL: 11 mg/dL (ref 5–40)

## 2020-01-18 LAB — URINALYSIS W/ RFLX MICROSCOPIC
Bilirubin, Urine: NEGATIVE
Bilirubin: NEGATIVE
Blood, Urine: NEGATIVE
Blood: NEGATIVE
Glucose, UA: NEGATIVE
Glucose: NEGATIVE
Ketone: NEGATIVE
Ketones, Urine: NEGATIVE
Leukocyte Esterase, Urine: NEGATIVE
Leukocyte Esterase: NEGATIVE
Nitrite, Urine: NEGATIVE
Nitrites: NEGATIVE
Protein, UA: NEGATIVE
Protein: NEGATIVE
Specific Gravity, UA: 1.028 NA (ref 1.005–1.030)
Specific Gravity: 1.028 (ref 1.005–1.030)
Urobilinogen, Urine: 0.2 mg/dL (ref 0.2–1.0)
Urobilinogen: 0.2 mg/dL (ref 0.2–1.0)
pH (UA): 5.5 (ref 5.0–7.5)
pH, UA: 5.5 NA (ref 5.0–7.5)

## 2020-01-18 LAB — COMPREHENSIVE METABOLIC PANEL
ALT: 17 IU/L (ref 0–44)
AST: 22 IU/L (ref 0–40)
Albumin/Globulin Ratio: 1.9 NA (ref 1.2–2.2)
Albumin: 4.6 g/dL (ref 3.8–4.9)
Alkaline Phosphatase: 46 IU/L — ABNORMAL LOW (ref 48–121)
BUN: 20 mg/dL (ref 8–27)
Bun/Cre Ratio: 24 NA (ref 10–24)
CO2: 24 mmol/L (ref 20–29)
Calcium: 9.3 mg/dL (ref 8.6–10.2)
Chloride: 102 mmol/L (ref 96–106)
Creatinine: 0.85 mg/dL (ref 0.76–1.27)
EGFR IF NonAfrican American: 95 mL/min/{1.73_m2} (ref >59)
GFR African American: 109 mL/min/{1.73_m2} (ref >59)
Globulin, Total: 2.4 g/dL (ref 1.5–4.5)
Glucose: 88 mg/dL (ref 65–99)
Potassium: 4 mmol/L (ref 3.5–5.2)
Sodium: 138 mmol/L (ref 134–144)
Total Bilirubin: 0.3 mg/dL (ref 0.0–1.2)
Total Protein: 7 g/dL (ref 6.0–8.5)

## 2020-01-18 LAB — PSA SCREENING: PSA: 0.7 ng/mL (ref 0.0–4.0)

## 2020-01-18 LAB — TSH 3RD GENERATION
TSH: 5.98 u[IU]/mL — ABNORMAL HIGH (ref 0.450–4.500)
TSH: 5.98 u[IU]/mL — ABNORMAL HIGH (ref 0.450–4.500)

## 2020-01-18 LAB — METABOLIC PANEL, COMPREHENSIVE
A-G Ratio: 1.9 (ref 1.2–2.2)
ALT (SGPT): 17 IU/L (ref 0–44)
AST (SGOT): 22 IU/L (ref 0–40)
Albumin: 4.6 g/dL (ref 3.8–4.9)
Alk. phosphatase: 46 IU/L — ABNORMAL LOW (ref 48–121)
BUN/Creatinine ratio: 24 (ref 10–24)
BUN: 20 mg/dL (ref 8–27)
Bilirubin, total: 0.3 mg/dL (ref 0.0–1.2)
CO2: 24 mmol/L (ref 20–29)
Calcium: 9.3 mg/dL (ref 8.6–10.2)
Chloride: 102 mmol/L (ref 96–106)
Creatinine: 0.85 mg/dL (ref 0.76–1.27)
GFR est AA: 109 mL/min/{1.73_m2} (ref >59)
GFR est non-AA: 95 mL/min/{1.73_m2} (ref >59)
GLOBULIN, TOTAL: 2.4 g/dL (ref 1.5–4.5)
Glucose: 88 mg/dL (ref 65–99)
Potassium: 4 mmol/L (ref 3.5–5.2)
Protein, total: 7 g/dL (ref 6.0–8.5)
Sodium: 138 mmol/L (ref 134–144)

## 2020-01-18 LAB — CBC WITH AUTOMATED DIFF
ABS. BASOPHILS: 0.1 10*3/uL (ref 0.0–0.2)
ABS. EOSINOPHILS: 0.2 10*3/uL (ref 0.0–0.4)
ABS. IMM. GRANS.: 0 10*3/uL (ref 0.0–0.1)
ABS. MONOCYTES: 0.5 10*3/uL (ref 0.1–0.9)
ABS. NEUTROPHILS: 4 10*3/uL (ref 1.4–7.0)
Abs Lymphocytes: 2 10*3/uL (ref 0.7–3.1)
BASOPHILS: 1 %
EOSINOPHILS: 3 %
HCT: 41.1 % (ref 37.5–51.0)
HGB: 13.9 g/dL (ref 13.0–17.7)
IMMATURE GRANULOCYTES: 0 %
Lymphocytes: 30 %
MCH: 30.4 pg (ref 26.6–33.0)
MCHC: 33.8 g/dL (ref 31.5–35.7)
MCV: 90 fL (ref 79–97)
MONOCYTES: 7 %
NEUTROPHILS: 59 %
PLATELET: 252 10*3/uL (ref 150–450)
RBC: 4.57 x10E6/uL (ref 4.14–5.80)
RDW: 12.7 % (ref 11.6–15.4)
WBC: 6.8 10*3/uL (ref 3.4–10.8)

## 2020-01-18 LAB — PSA SCREENING (SCREENING): Prostate Specific Ag: 0.7 ng/mL (ref 0.0–4.0)

## 2020-01-22 LAB — THYROID PEROXIDASE (TPO) AB
Thyroid Peroxidase Antibody: <9 IU/mL (ref 0–34)
Thyroid peroxidase Ab: <9 IU/mL (ref 0–34)

## 2020-01-22 LAB — SPECIMEN STATUS REPORT

## 2020-04-02 MED ORDER — FENOFIBRATE 160 MG TAB
160 mg | ORAL_TABLET | ORAL | 1 refills | Status: DC
Start: 2020-04-02 — End: 2020-10-05

## 2020-06-18 NOTE — Telephone Encounter (Signed)
Will forward to MD. Showing filled by historical provider. Ready to send.

## 2020-06-18 NOTE — Telephone Encounter (Signed)
 Requested Prescriptions     Pending Prescriptions Disp Refills   . omeprazole  (PRILOSEC) 20 mg capsule       Sig: Take 1 Capsule by mouth daily.           [id number] - [place], [place] - [phone]

## 2020-06-19 MED ORDER — OMEPRAZOLE 20 MG CAP, DELAYED RELEASE
20 mg | ORAL_CAPSULE | Freq: Every day | ORAL | 1 refills | Status: DC
Start: 2020-06-19 — End: 2020-12-23

## 2020-06-19 MED ORDER — OMEPRAZOLE 20 MG CAP, DELAYED RELEASE
20 mg | ORAL_CAPSULE | Freq: Every day | ORAL | 1 refills | Status: DC
Start: 2020-06-19 — End: 2020-06-19

## 2020-10-05 MED ORDER — FENOFIBRATE 160 MG TAB
160 mg | ORAL_TABLET | ORAL | 1 refills | Status: DC
Start: 2020-10-05 — End: 2021-05-19

## 2020-10-14 IMAGING — CR XR CHEST 1 VIEW
1 series · 1 of 1 positions shown · non-contrast
Comparison: none

Shortness of breath
SINGLE PORTABLE CHEST:
CLINICAL INDICATION: Shortness of breath (Ped 0-18y). Covid positive
REFERENCE: 04/16/2019

[AP]
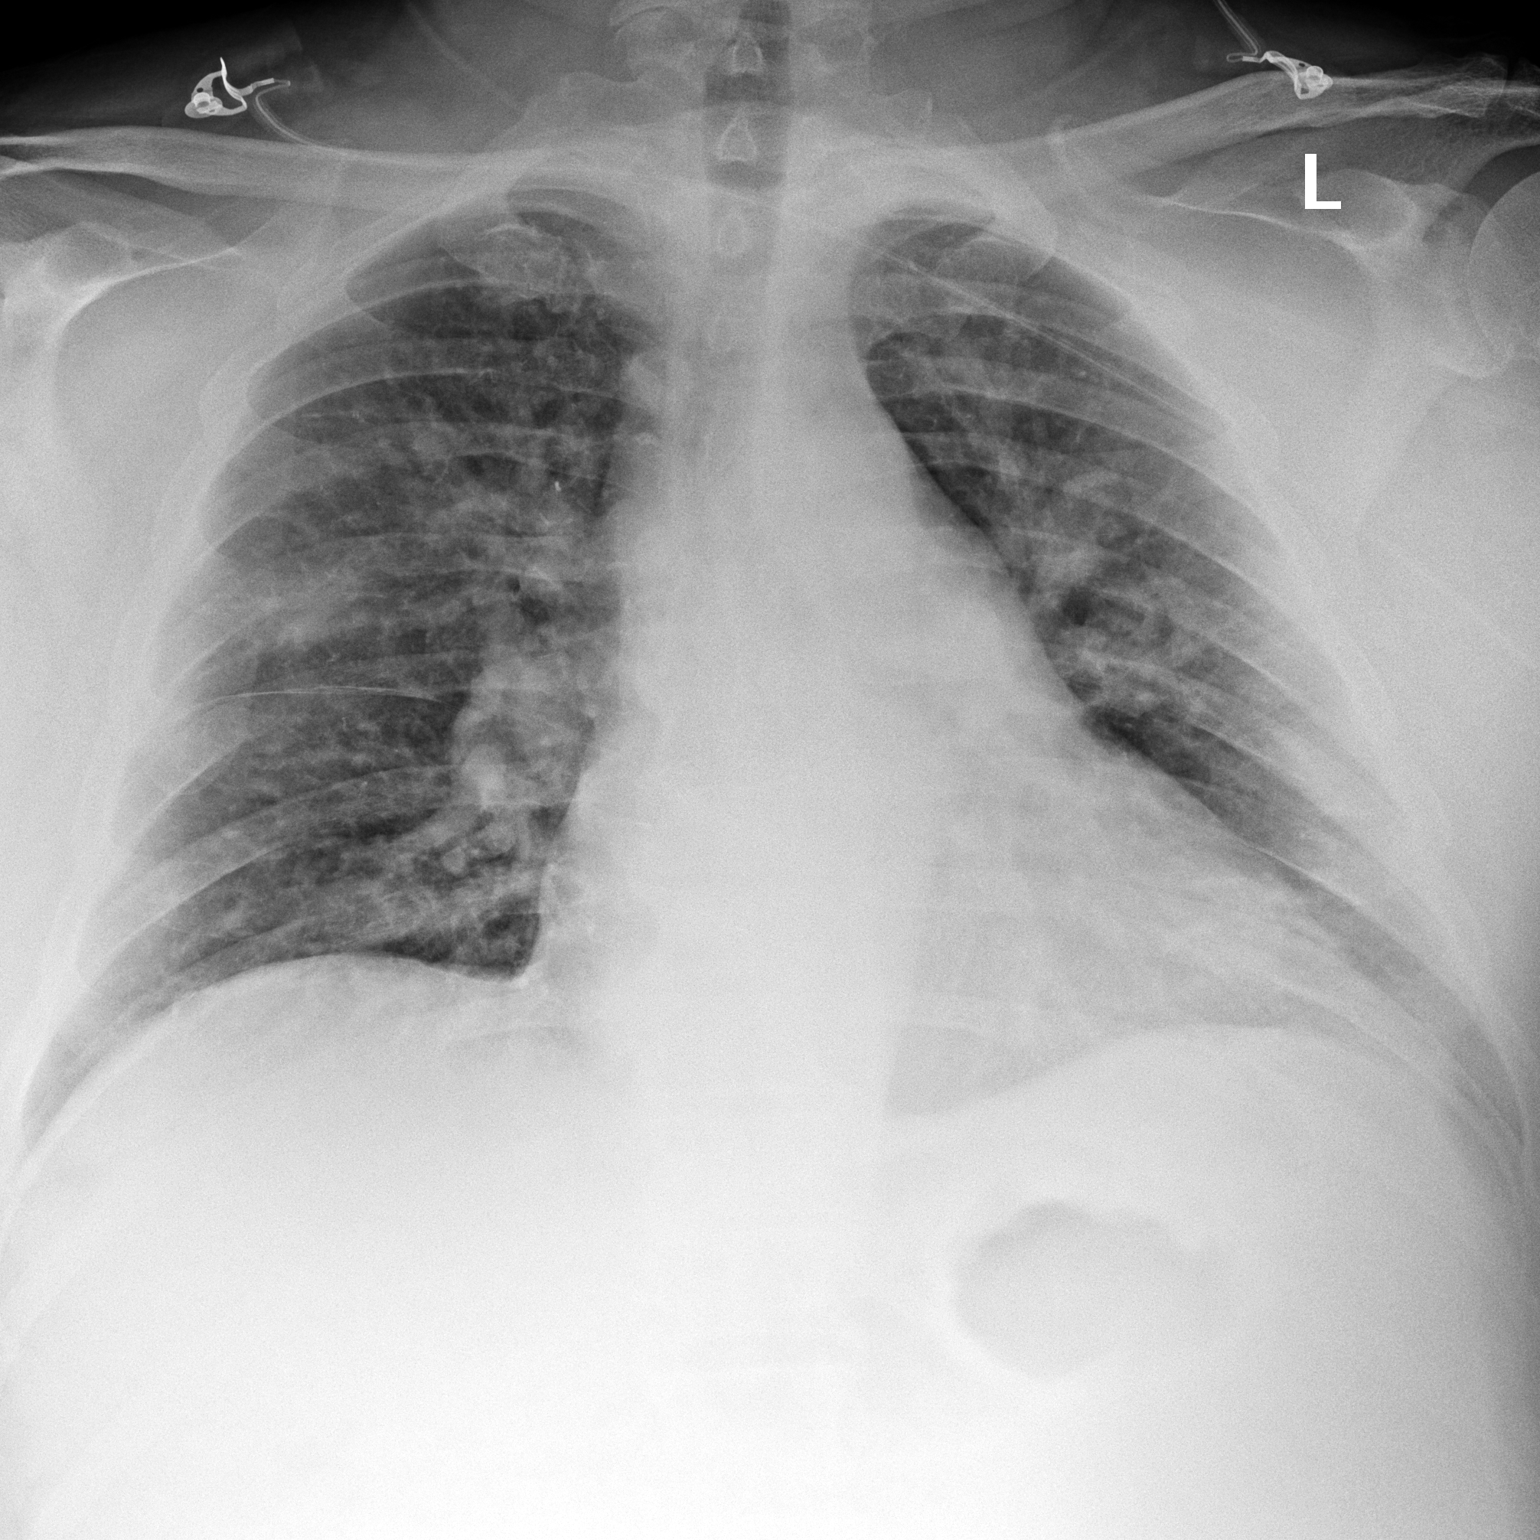

[1 of 1 positions shown; findings below may reference images not displayed]

FINDINGS: There is slightly increasing interstitial opacities in both lungs consistent with known bilateral pneumonia/pneumonitis. There is no pleural effusion, pneumothorax or mass.
The heart is normal in size. The pulmonary vascularity is normal. There are no visible mediastinal masses.
There is mild degenerative change of the spine.
IMPRESSION: Increasing bilateral interstitial pulmonary opacities in a pattern most consistent with known bilateral viral pneumonia/pneumonitis.
LOCATION CODE: 7
Portable?
Yes

## 2020-10-14 IMAGING — CT CT CHEST PULMONARY EMBOLISM W IV CONTRAST
2 of 5 series · 13 of 36 positions shown · non-contrast
Comparison: none

CT angiogram of the chest. [DATE], [id number] hours
CLINICAL HISTORY: Persistent cough, hypoxia
TECHNIQUE: Helical axial sections with sagittal and coronal reformats of the chest were obtained with intravenous contrast. Iterative reconstruction technique was employed to reduce patient radiation exposure.
Radiation Dose: Total exam DLP 1607.48 mGy/cm.

[Series 2: pe chest · axial · 0.92mm/px · z∈[-330,-27]mm · 10 of 149 slices shown]
[im 14/149  lung]
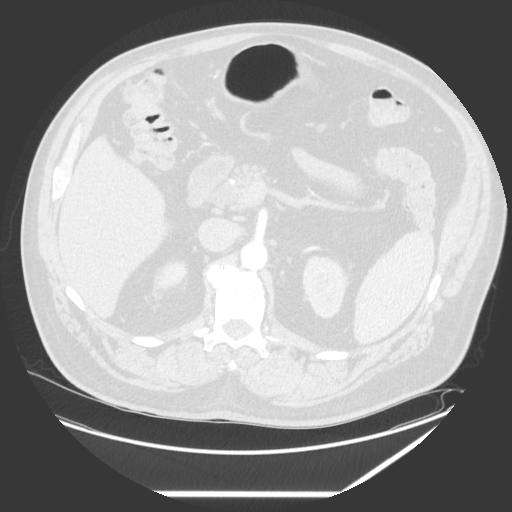
[im 27/149  mediastinal]
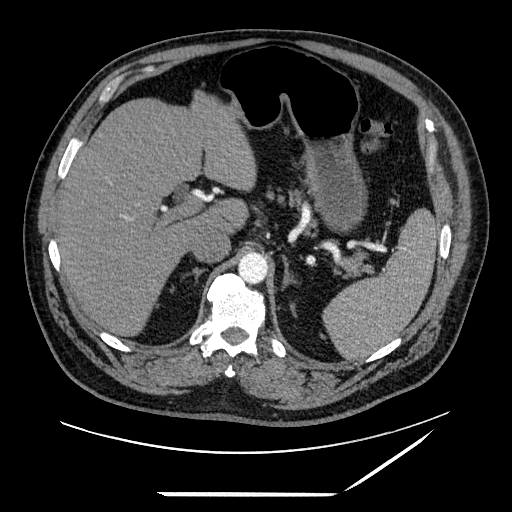
[im 41/149  lung]
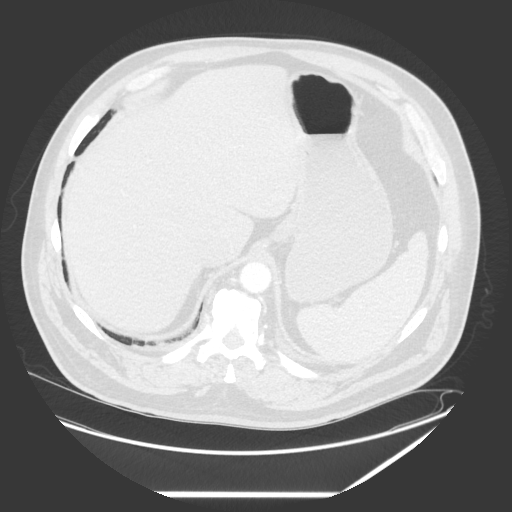
[im 54/149  mediastinal]
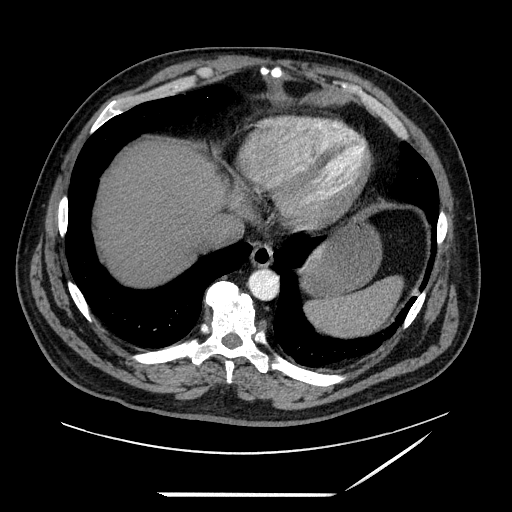
[im 68/149  lung]
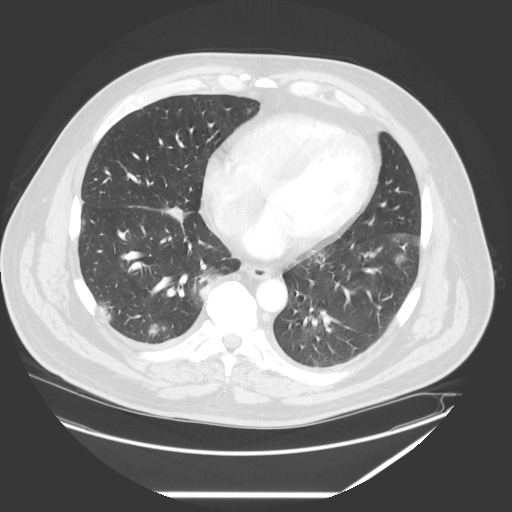
[im 81/149  mediastinal]
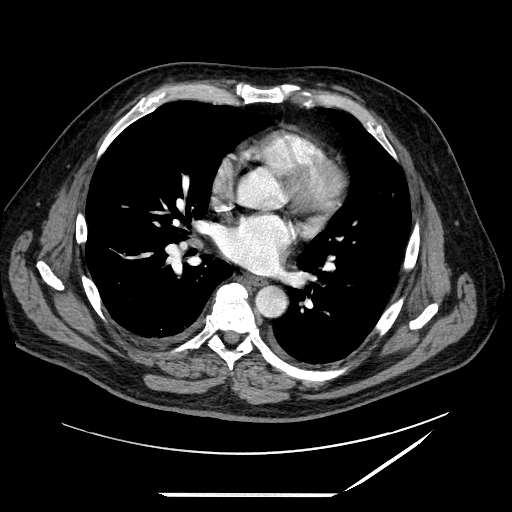
[im 95/149  lung]
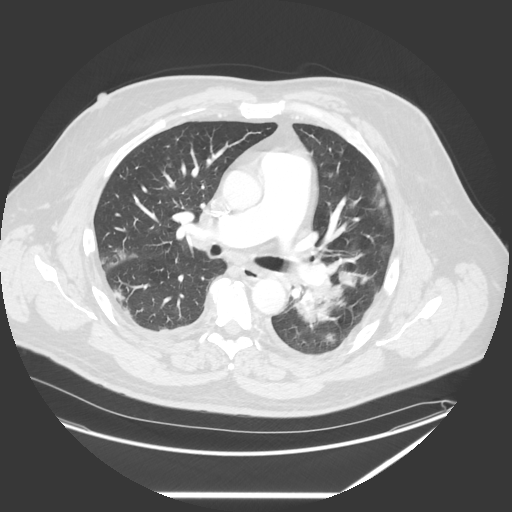
[im 108/149  mediastinal]
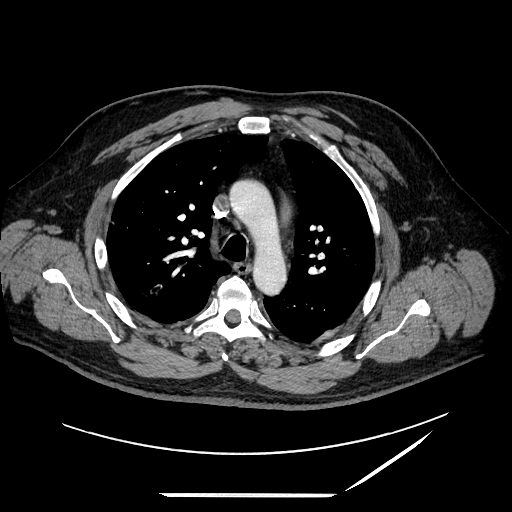
[im 122/149  lung]
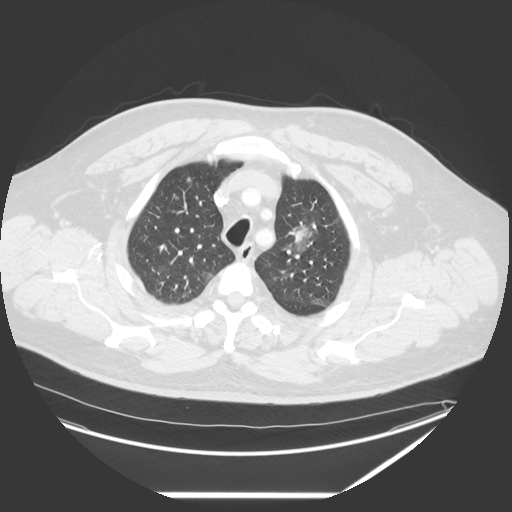
[im 135/149  mediastinal]
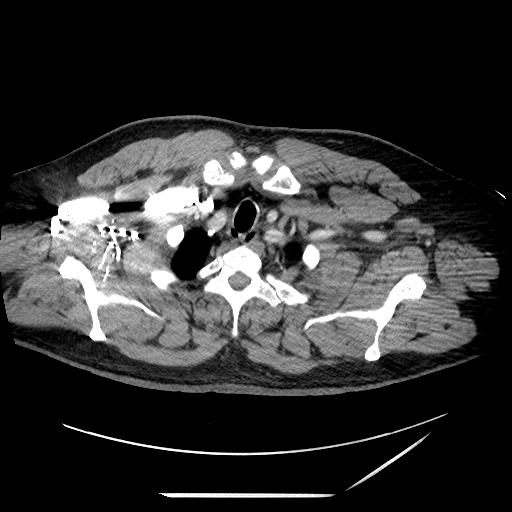

[Series 601: sag standard 2x2 · sagittal · 0.92mm/px · 3 of 235 slices shown]
[im 47/235  mediastinal]
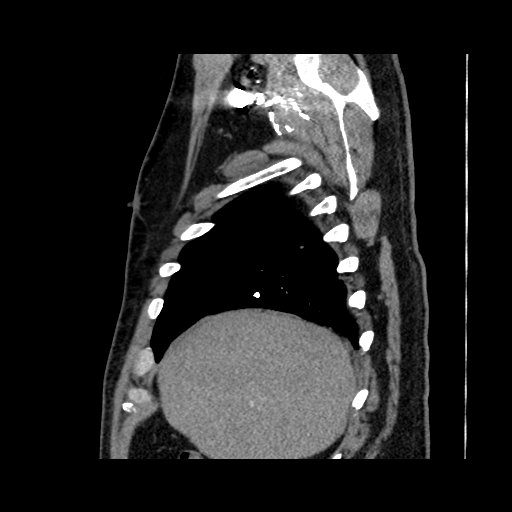
[im 94/235  mediastinal]
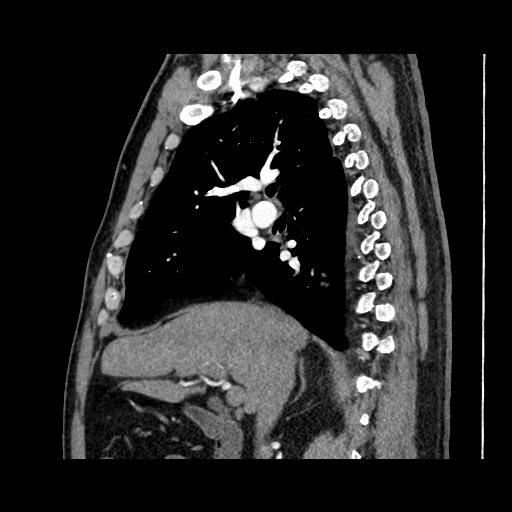
[im 141/235  mediastinal]
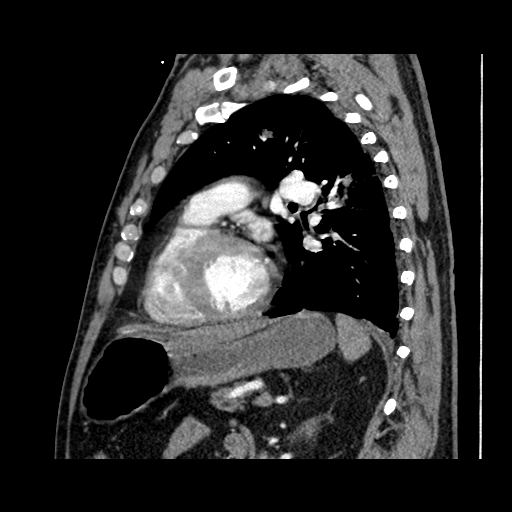

[13 of 36 positions shown; findings below may reference images not displayed]

FINDINGS: There is no filling defect within the pulmonary artery divisions to suggest pulmonary thromboembolism. The thoracic aorta demonstrates atheromatous calcification without evidence of dissection or aneurysm. The main pulmonary arteries are dilated with the pulmonary trunk measuring 3.9 cm, which may represent pulmonary arterial hypertension.
Nonspecific subcentimeter mediastinal nodes noted. There is no pericardial effusion.
Multiple nodular and patchy soft tissue dense opacities with peripheral ground-glass opacification are noted in both the lungs, the largest in the left lower lobe measures approximately 3 cm. There are also multiple small ill-defined ground-glass opacities in the lungs. Mild bilateral pleural thickening noted. No evidence of pleural effusion or pneumothorax.
Moderate degenerative changes are noted in the spine. Small hyperdense areas noted in the 2 solid and T8 vertebra likely vertebroplasty changes.
Spleen is enlarged measuring 15 cm.  The  visualized abdominal viscera otherwise appear unremarkable.
IMPRESSION: No CT evidence of pulmonary thromboembolism.
Bilateral multiple soft tissue density and ground-glass opacities as described, likely of infectious etiology. The possibility of metastasis cannot be entirely excluded.
Pulmonary arterial hypertension.
Moderate splenomegaly.
Recommended clinical, lab correlation and further evaluation.
.

## 2020-10-19 IMAGING — CR XR CHEST 1 VIEW
1 series · 2 of 2 positions shown · non-contrast
Comparison: none

Sob/recent diagnosis of covid
SINGLE PORTABLE CHEST:
CLINICAL INDICATION: follow up pneumonia
REFERENCE: 10/14/2020

[Series 5956: AP · 2 of 2 slices shown]
[im 1/2]
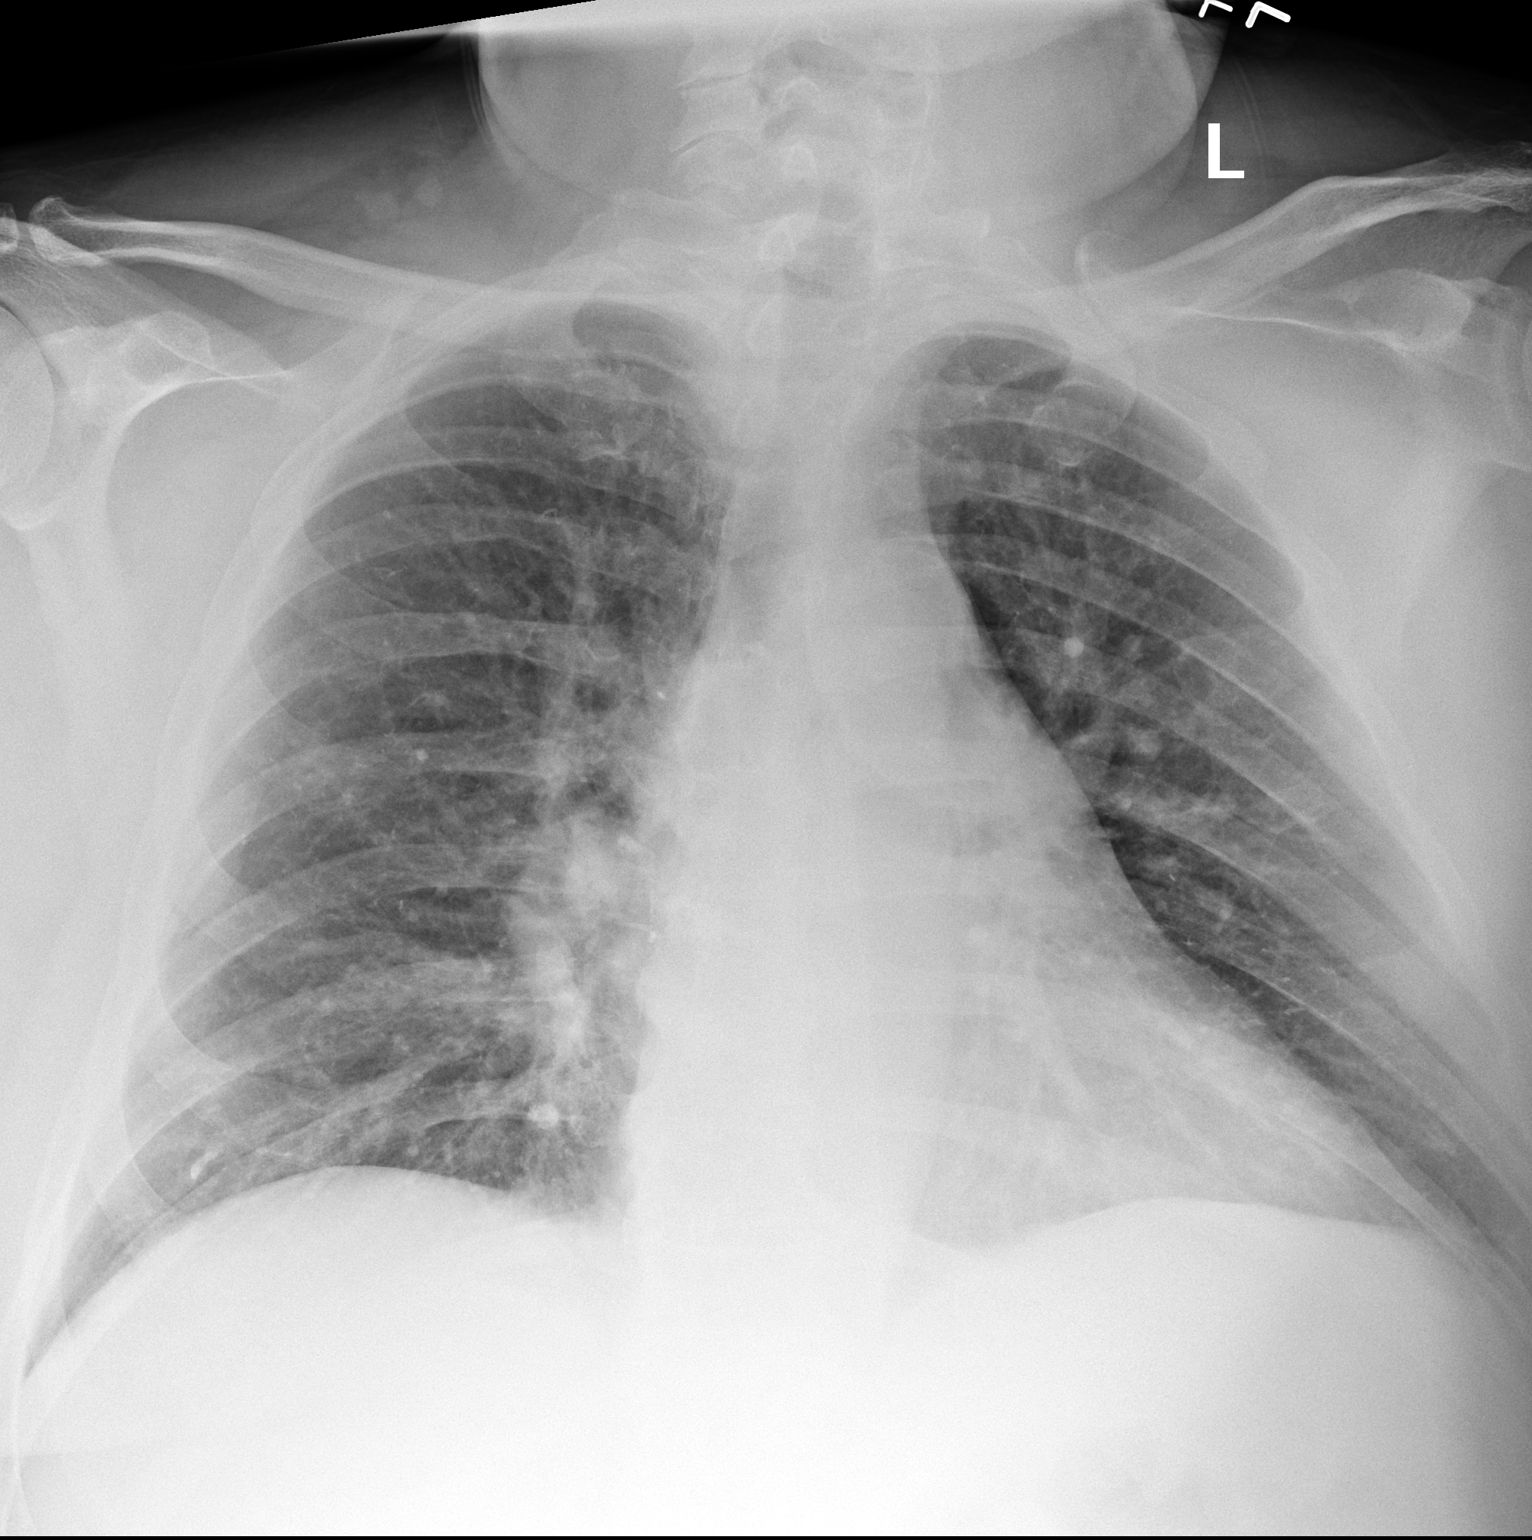
[im 2/2]
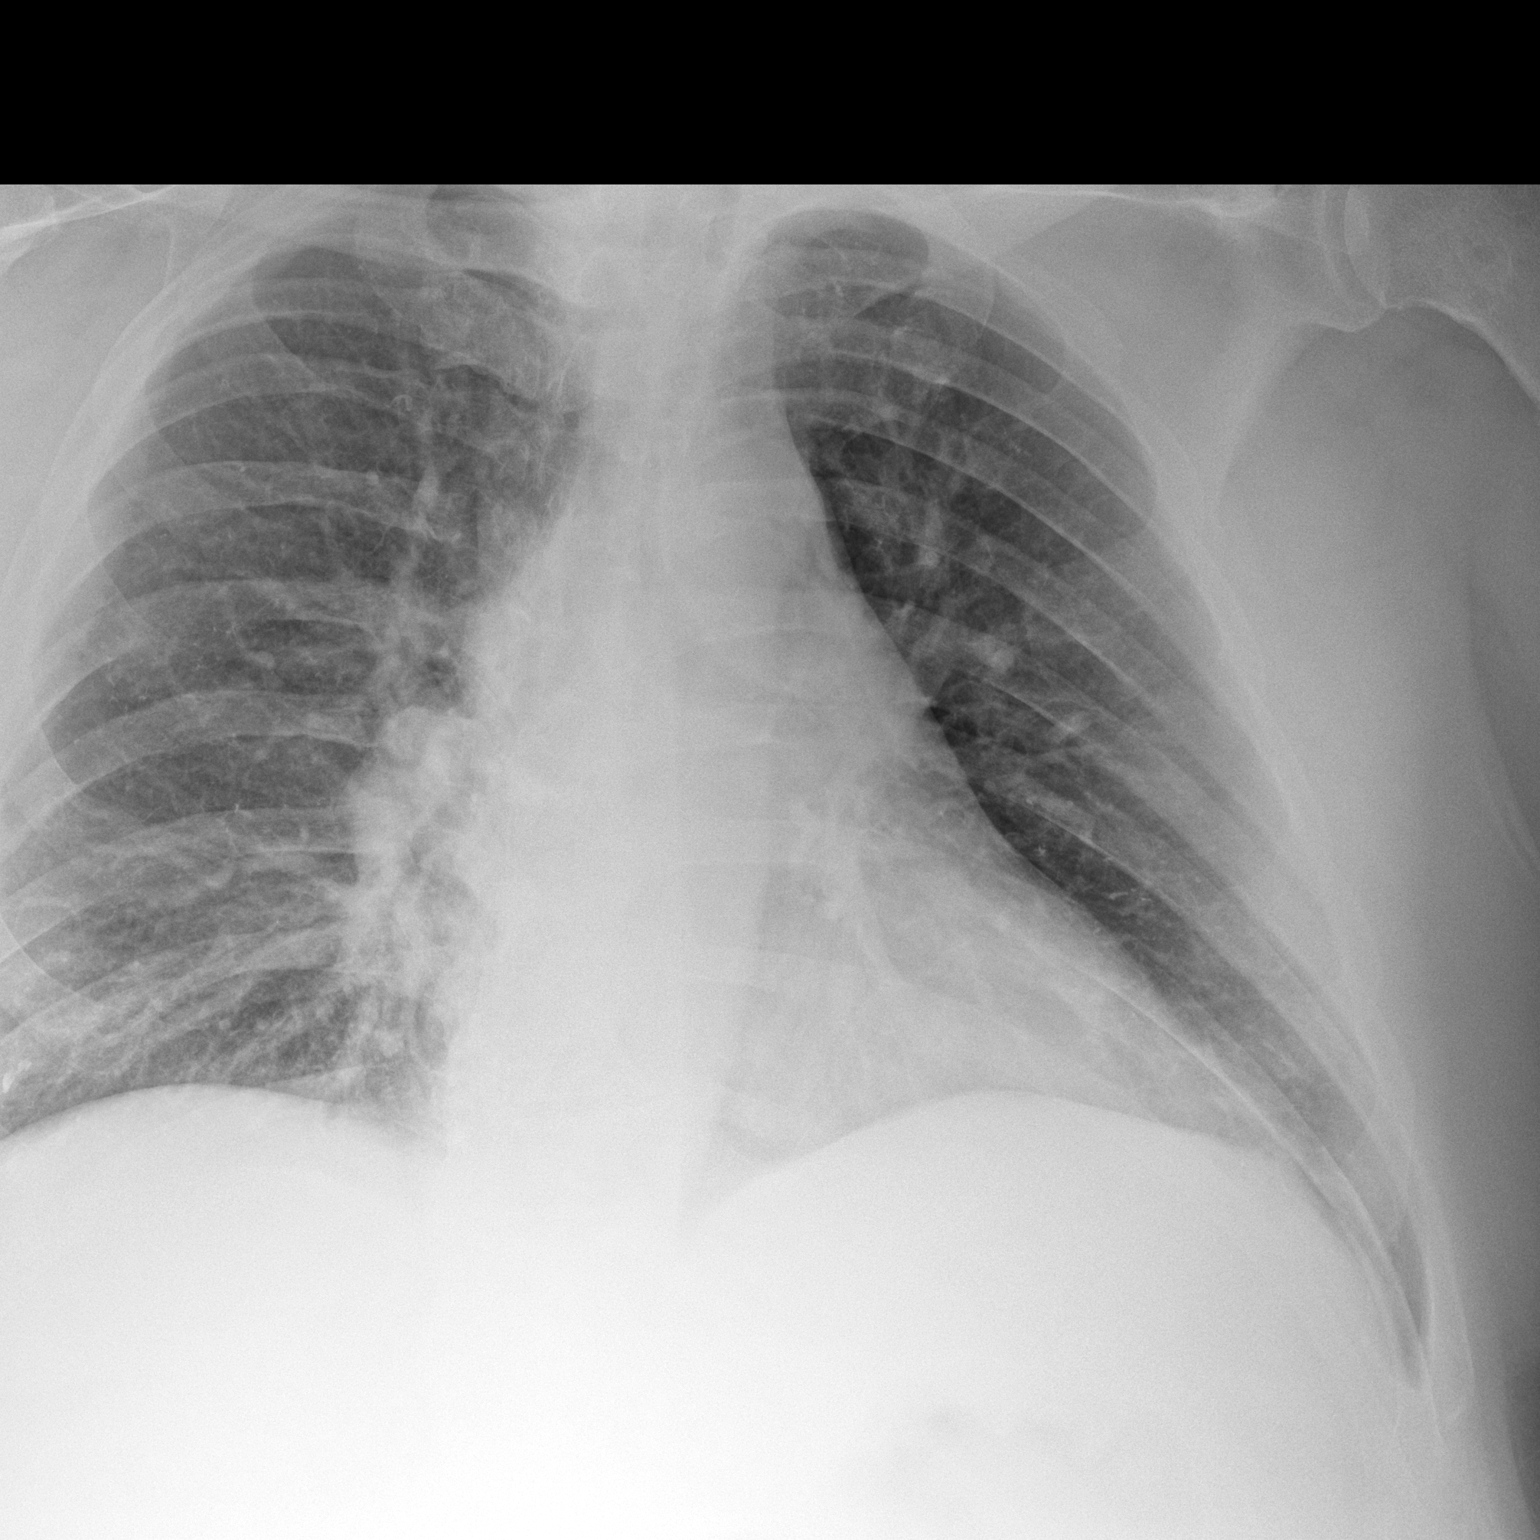

[2 of 2 positions shown; findings below may reference images not displayed]

FINDINGS: Single radiograph of the chest demonstrates normal cardiomediastinal contours.
The lungs demonstrate significant improvement in bilateral groundglass opacities supportive of improving pneumonia..
Surrounding osseous and soft tissue structures are unremarkable.
IMPRESSION: Improving bilateral pneumonia.
LOCATION CODE: 1

## 2020-11-25 IMAGING — CT CT KNEE RIGHT WITHOUT IV CONTRAST
2 of 4 series · 7 of 33 positions shown, 8 images · non-contrast
Comparison: none

CT KNEE RIGHT WITHOUT IV CONTRAST
INDICATION: Presence of right artificial knee joint
TECHNIQUE: Axial CT right knee without contrast. Coronal sagittal reconstructions performed and reviewed.

[Series 5: (id) · axial · 0.33mm/px · z∈[-382,-216]mm · 4 of 125 slices shown, 5 images]
[im 21/125  soft-tissue]
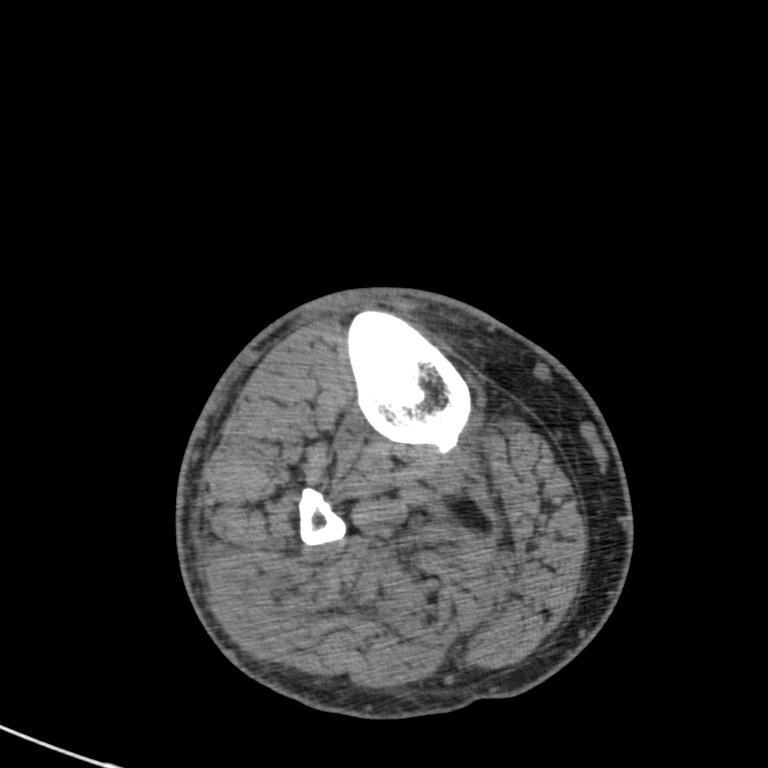
[im 21/125  bone]
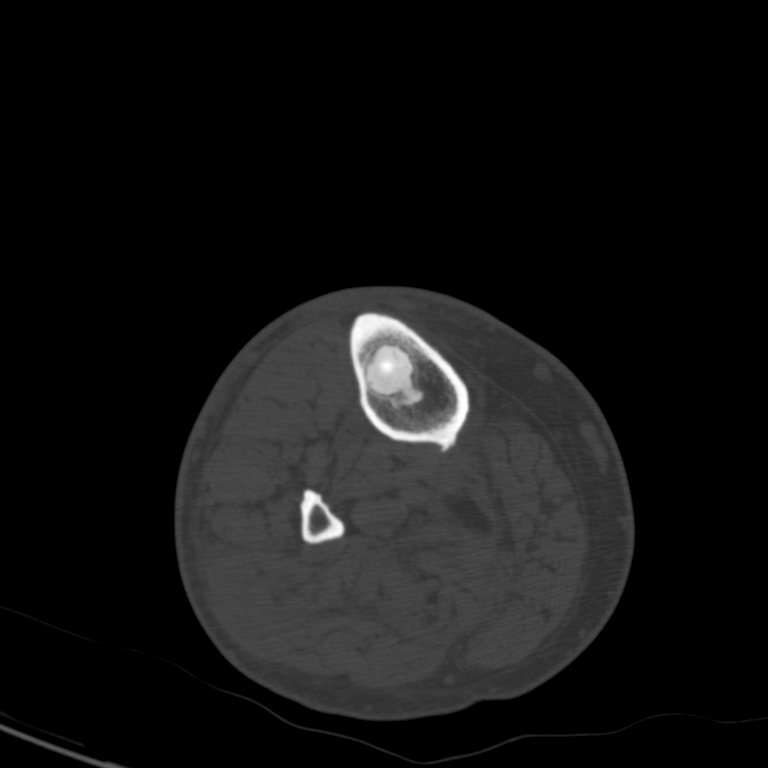
[im 42/125  bone]
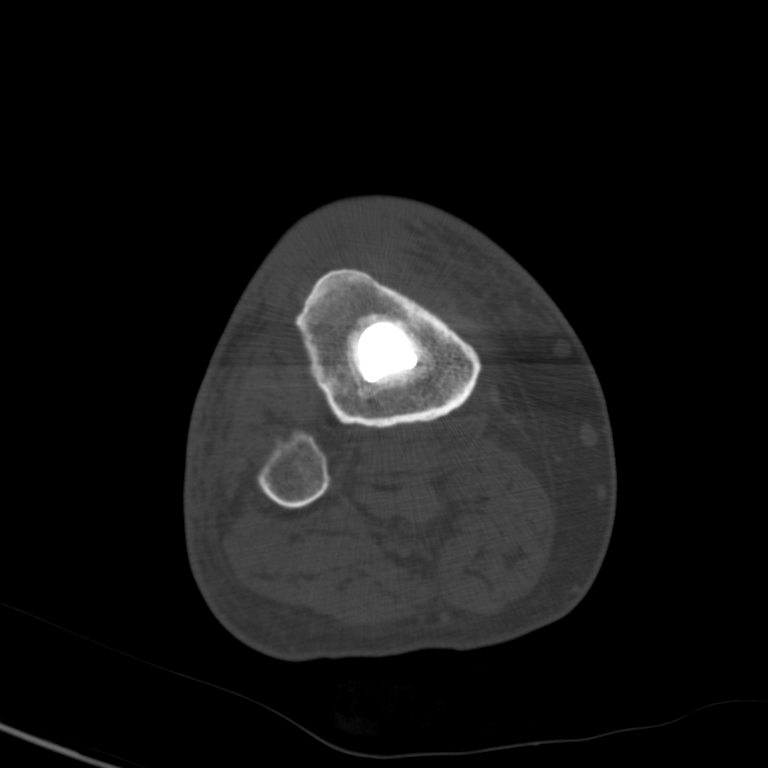
[im 83/125  bone]
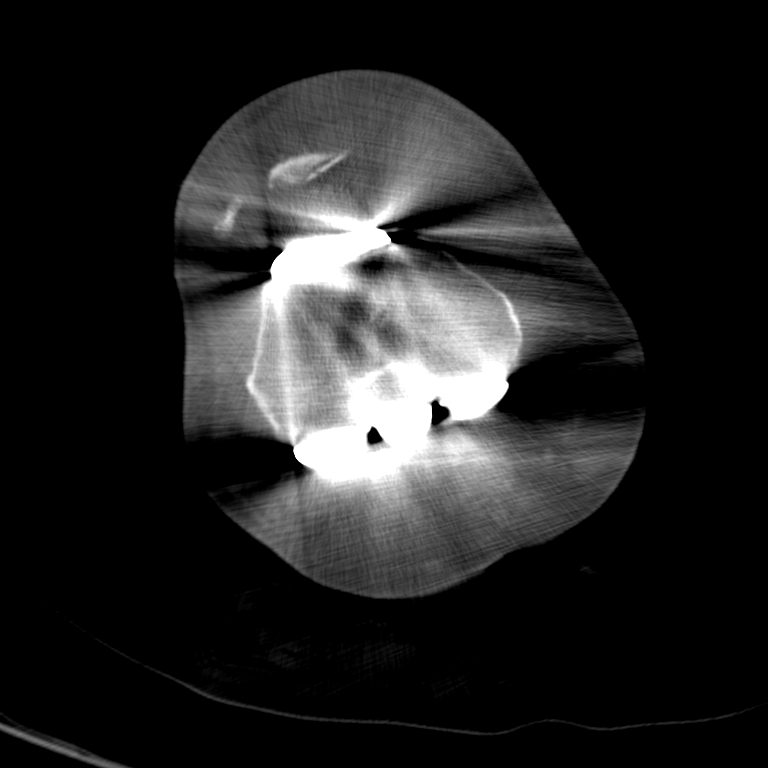
[im 104/125  bone]
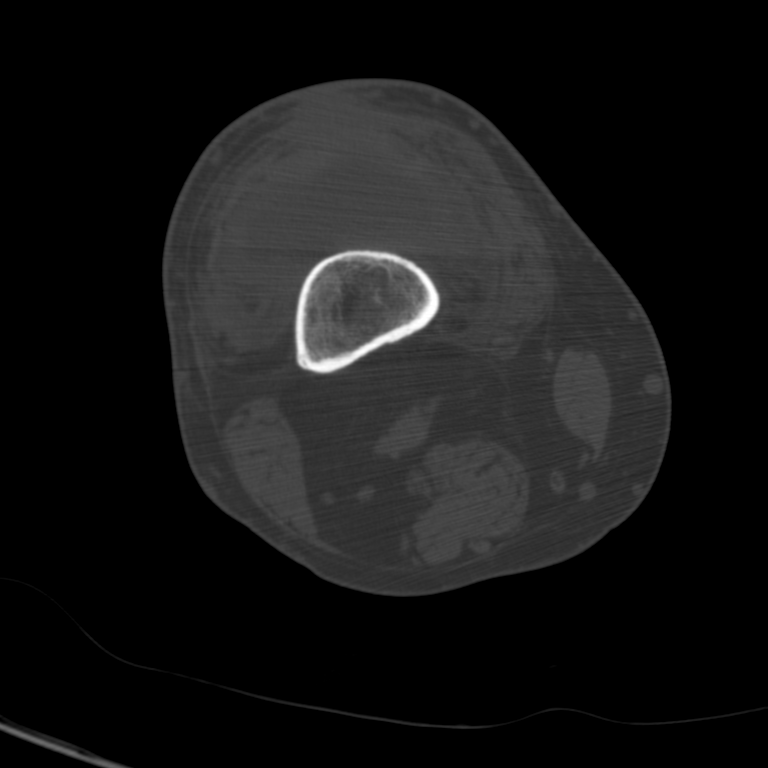

[mpr, coronals, coronal · coronal · 0.33mm/px · 3 of 82 slices shown]
[im 17/82  bone]
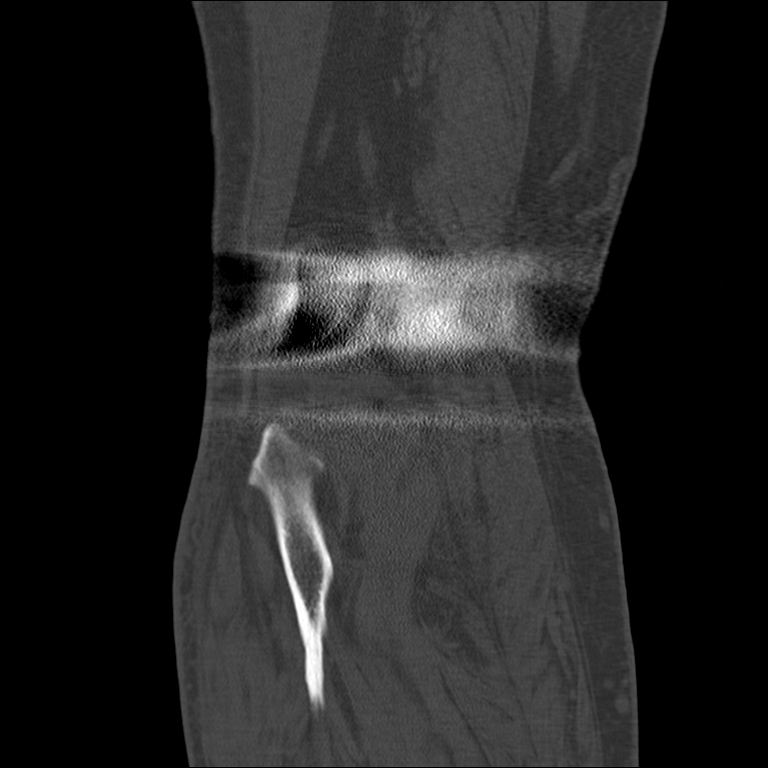
[im 33/82  bone]
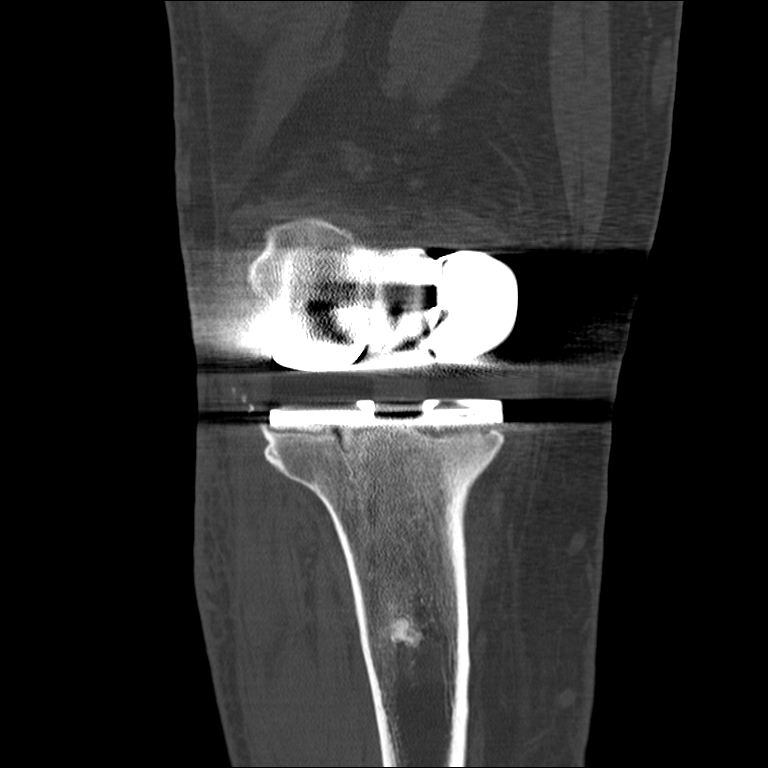
[im 49/82  bone]
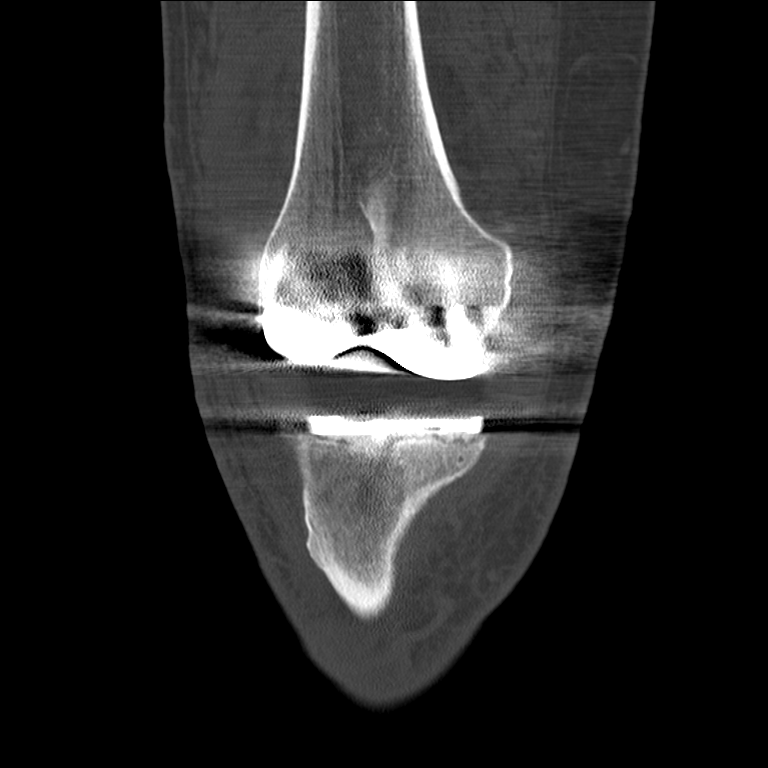

[7 of 33 positions shown; findings below may reference images not displayed]

FINDINGS: Postsurgical changes of right knee arthroplasty. Grossly normal alignment. No periprosthetic fracture lucency identified. Moderate to large joint effusion.
IMPRESSION: 
IMPRESSION: 1. Postsurgical changes of right knee arthroplasty.
2. Moderate to large joint effusion.. There is concern for an infectious or inflammatory process, then arthrocentesis should be considered.
Location 1

## 2020-12-02 IMAGING — DX XR CHEST 2 VIEWS
1 series · 2 of 2 positions shown · non-contrast
Comparison: 10/19/2020
VIEWS: 2

TWO VIEW CHEST:
CLINICAL INDICATION: Encounter for other preprocedural examination

[Series 4196: left lateral · U · 0.19mm/px · 2 of 2 slices shown]
[im 1/2]
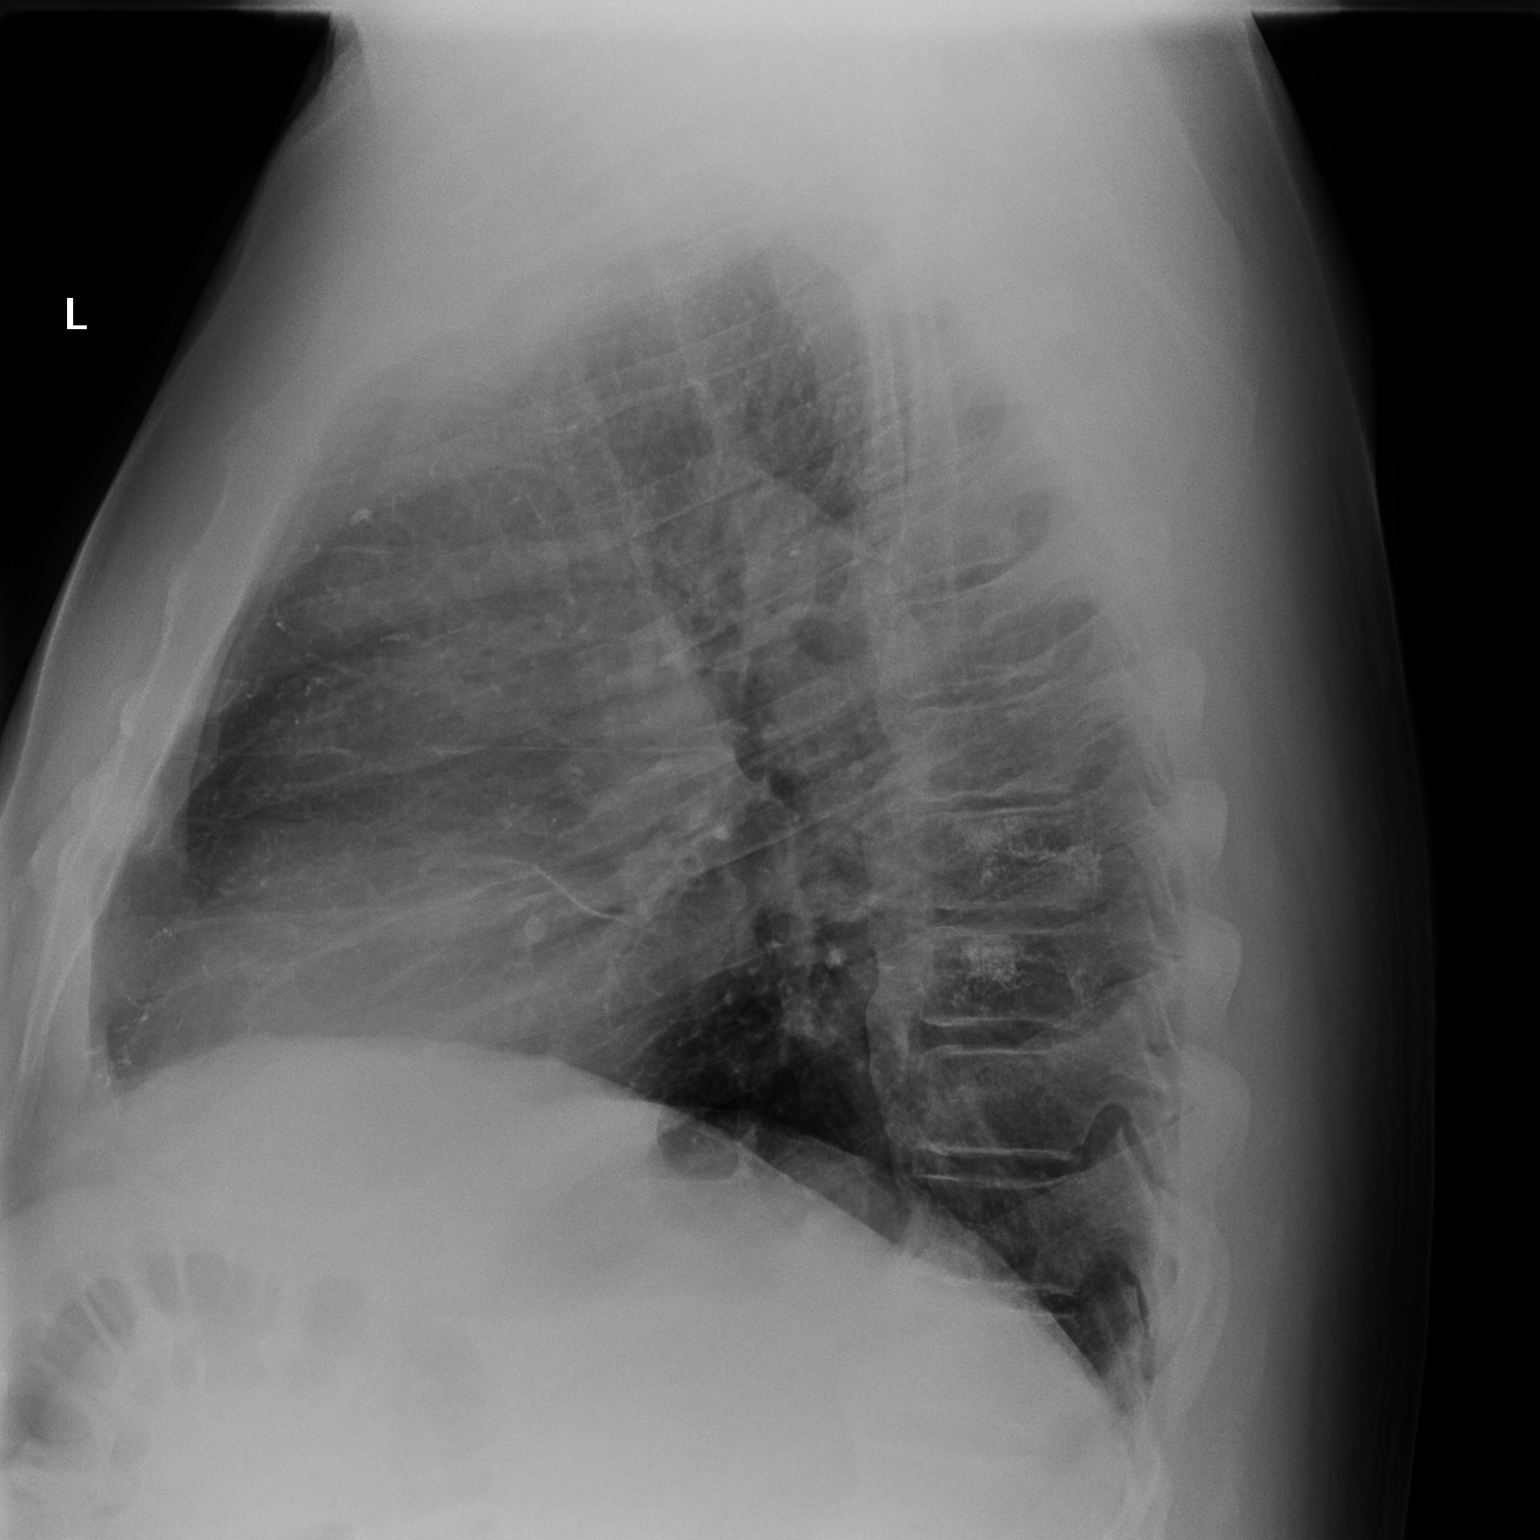
[im 2/2]
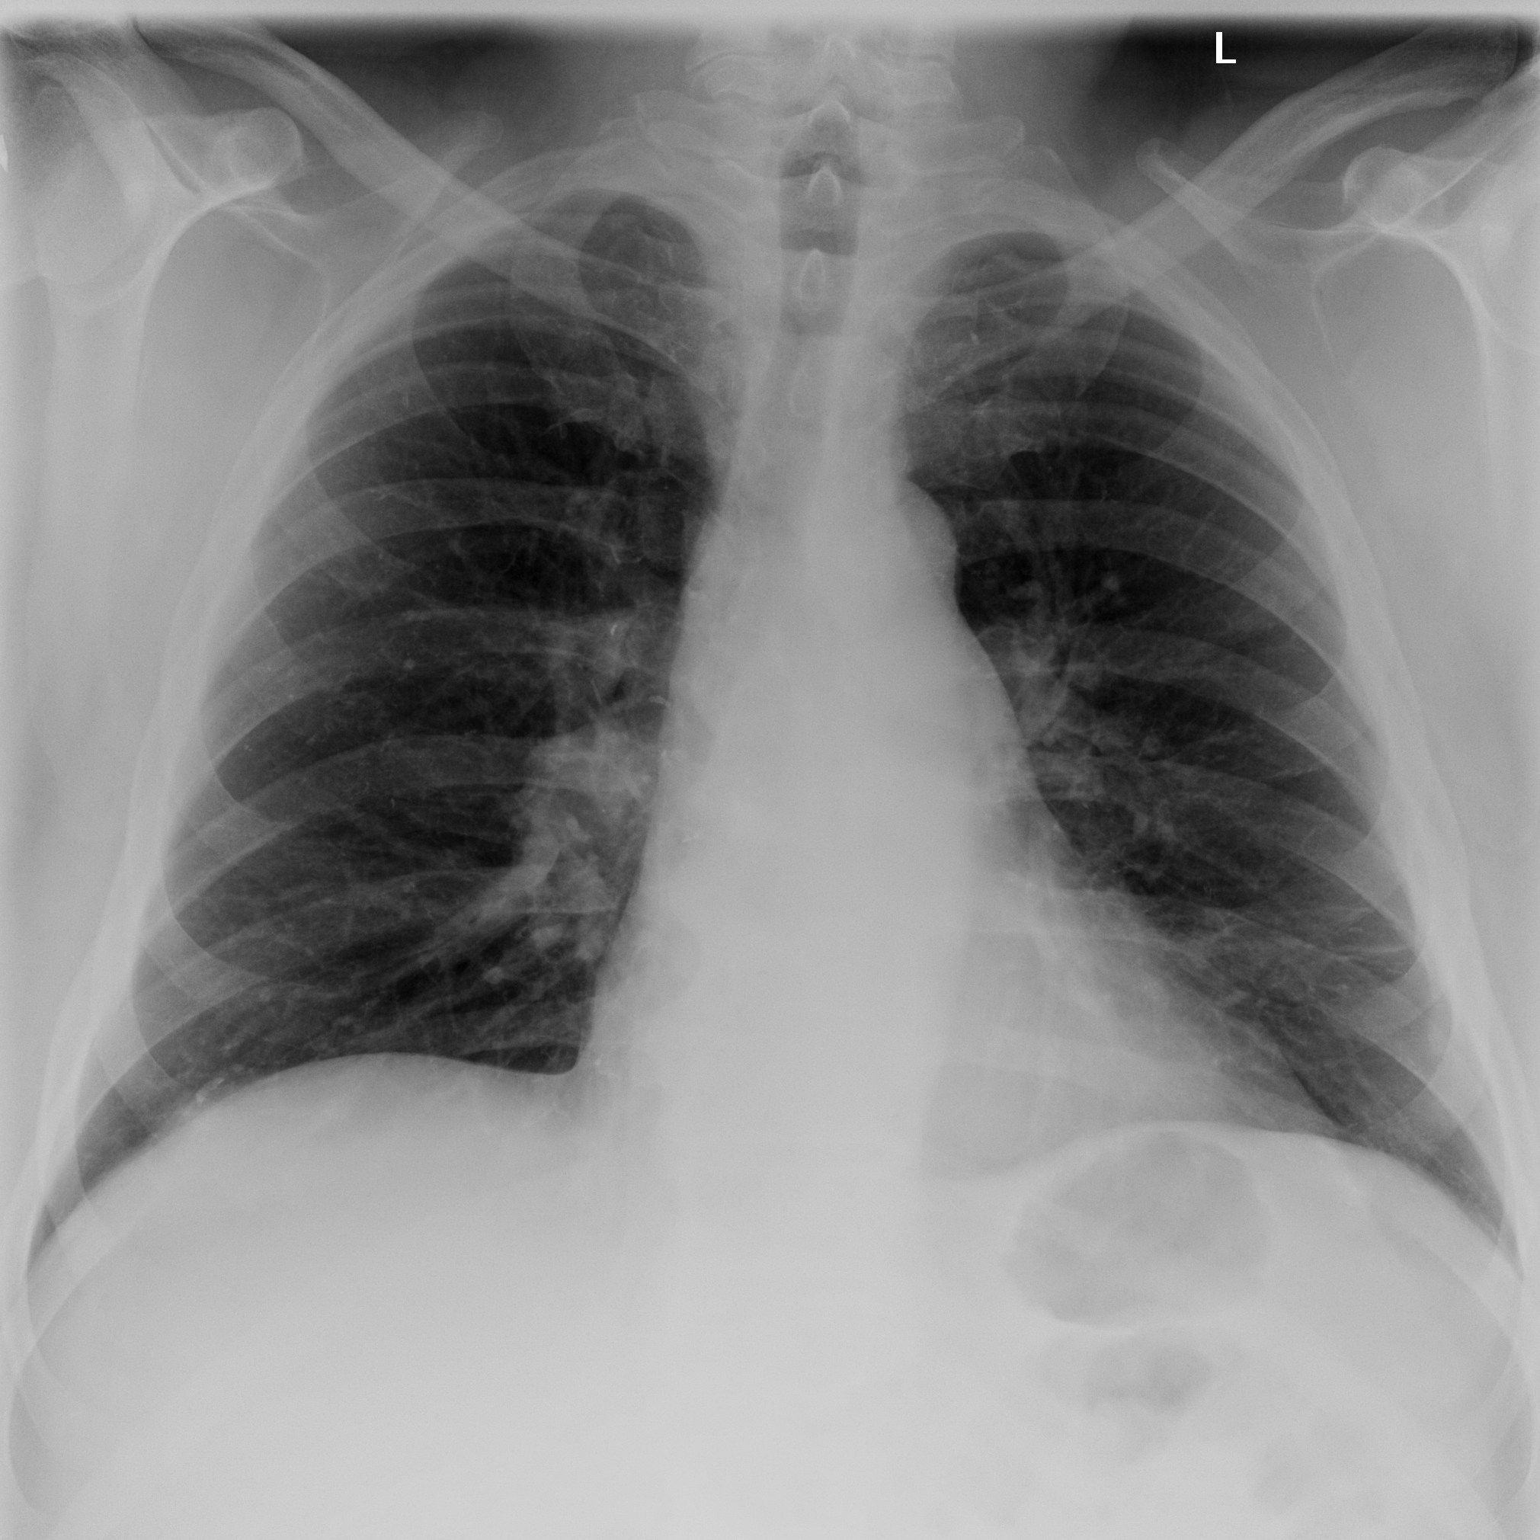

[2 of 2 positions shown; findings below may reference images not displayed]

FINDINGS: LUNGS/PLEURA: Clear.  No effusions.
HEART/MEDIASTINUM: No cardiomegaly. No mediastinal or hilar mass.
LIFE SUPPORT LINES: None.
PNEUMOTHORAX: None.
OSSEOUS STRUCTURES: No acute fracture or destructive lesion.
IMPRESSION: No evidence of acute cardiopulmonary process.
LOCATION CODE: 13

## 2020-12-13 IMAGING — CR XR KNEE 3 VIEWS RIGHT
1 series · 2 of 2 positions shown · non-contrast
Comparison: none

FINAL REPORT:
RIGHT KNEE.
INDICATION: worsening joint infection

[Series 2054: AP · right · 2 of 2 slices shown]
[im 1/2]
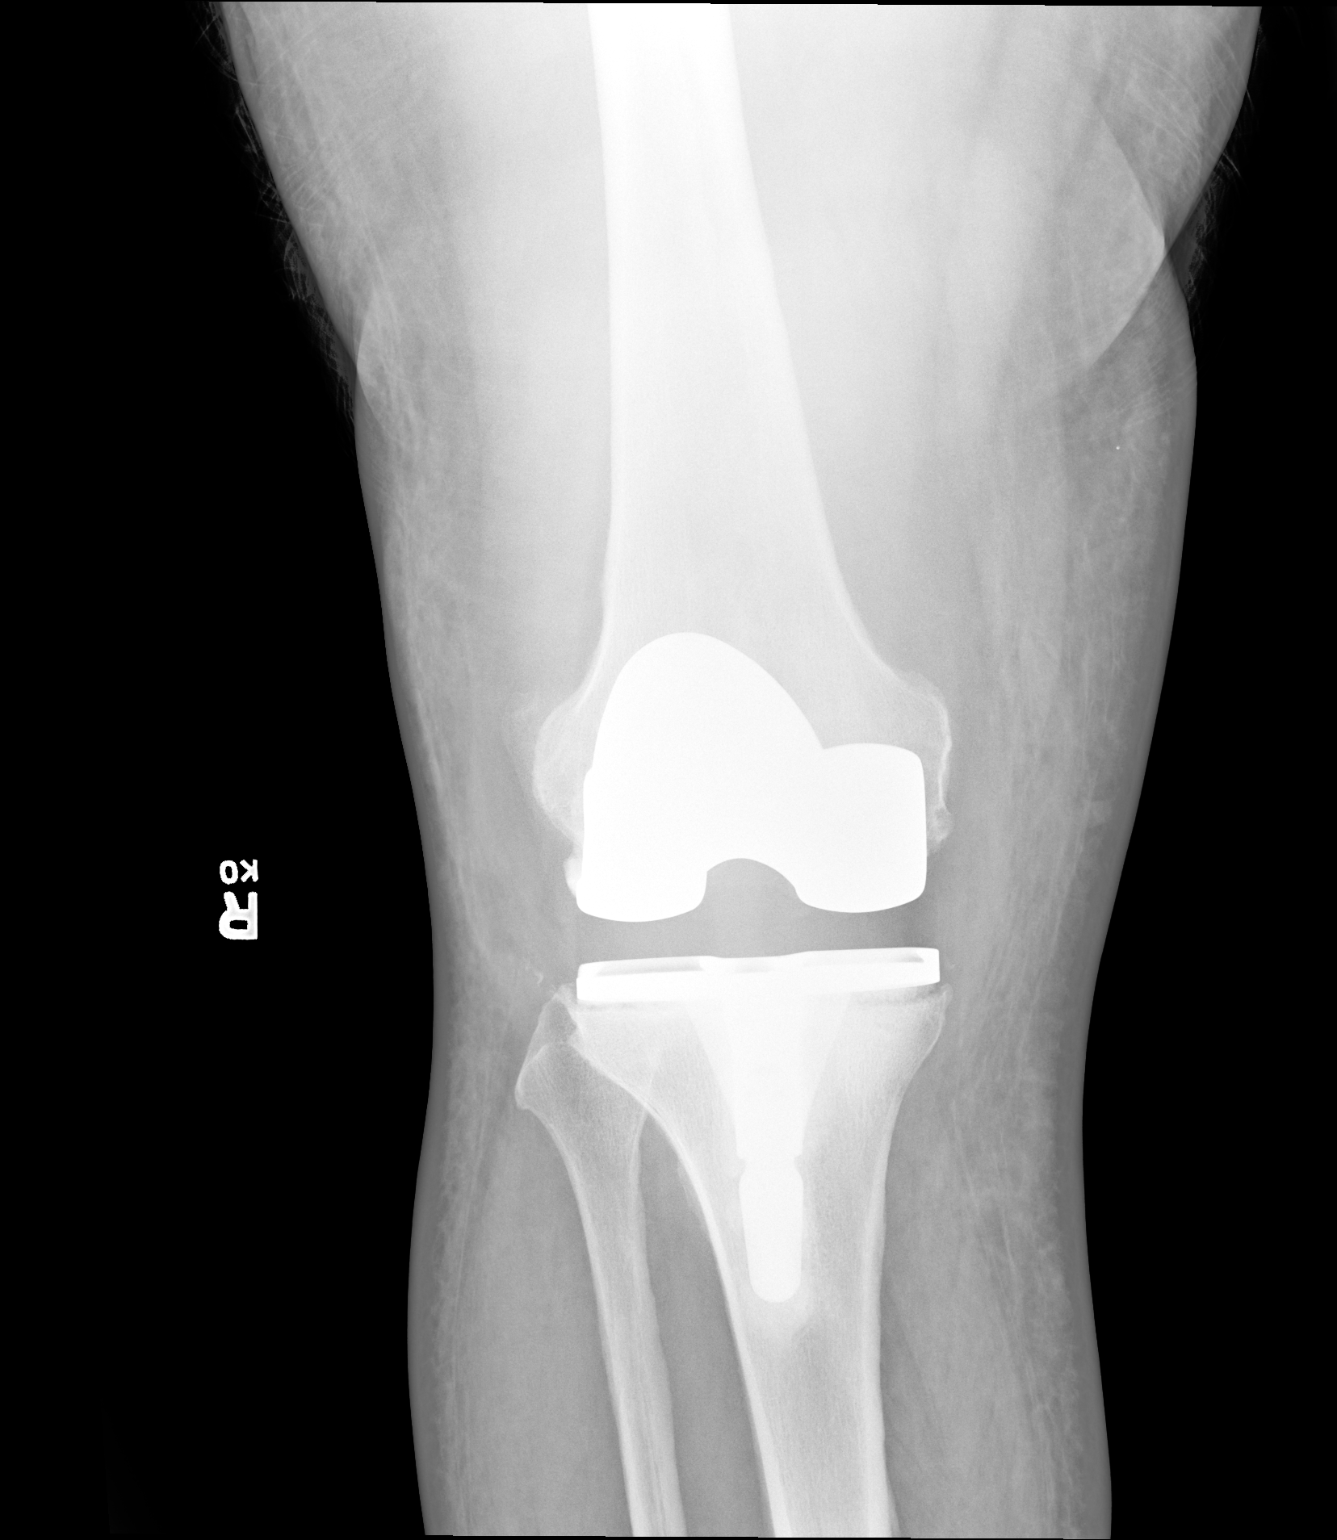
[im 2/2]
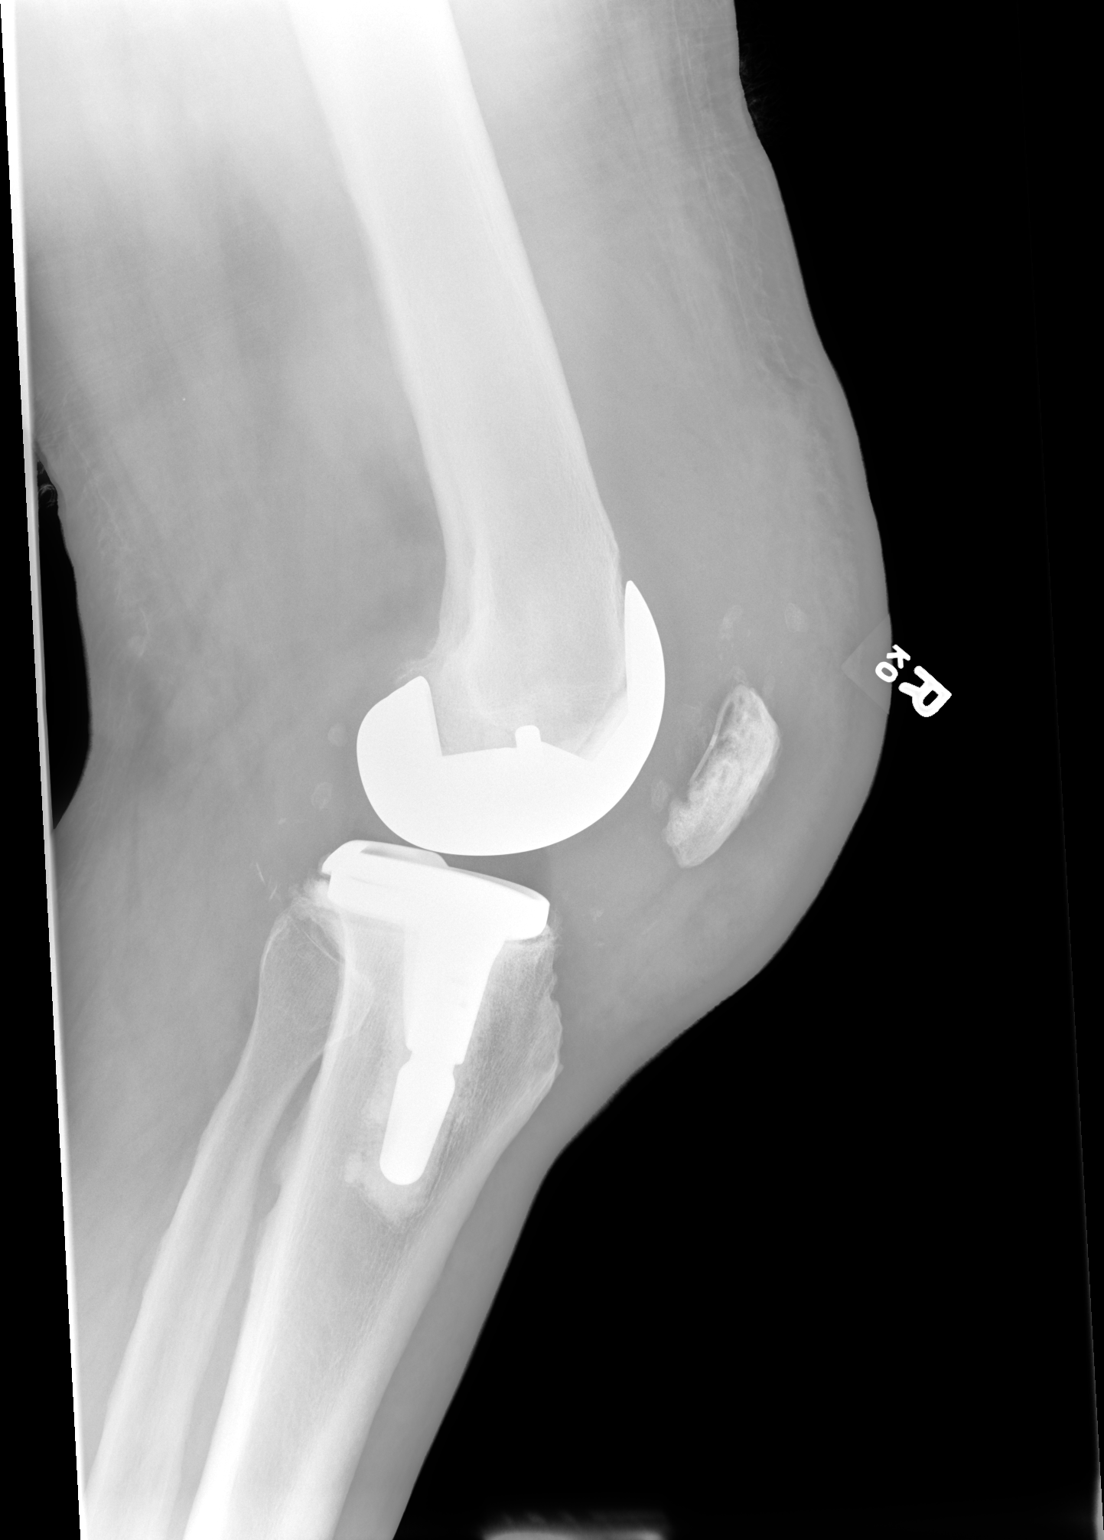

[2 of 2 positions shown; findings below may reference images not displayed]

FINDINGS: AP and crosstable lateral  views of the right knee.  No fracture, dislocation, or malalignment.  Joint spaces are grossly maintained. Significant anterior soft tissue swelling is noted.
IMPRESSION: 
IMPRESSION: Significant anterior knee soft tissue swelling is concerning for an infectious or inflammatory process.
Location 1

## 2020-12-13 IMAGING — CR XR CHEST 1 VIEW
1 series · 2 of 2 positions shown · non-contrast
Comparison: none

FINAL REPORT:
FRONTAL  CHEST.
INDICATION: fever

[Series 2048: AP · 2 of 2 slices shown]
[im 1/2]
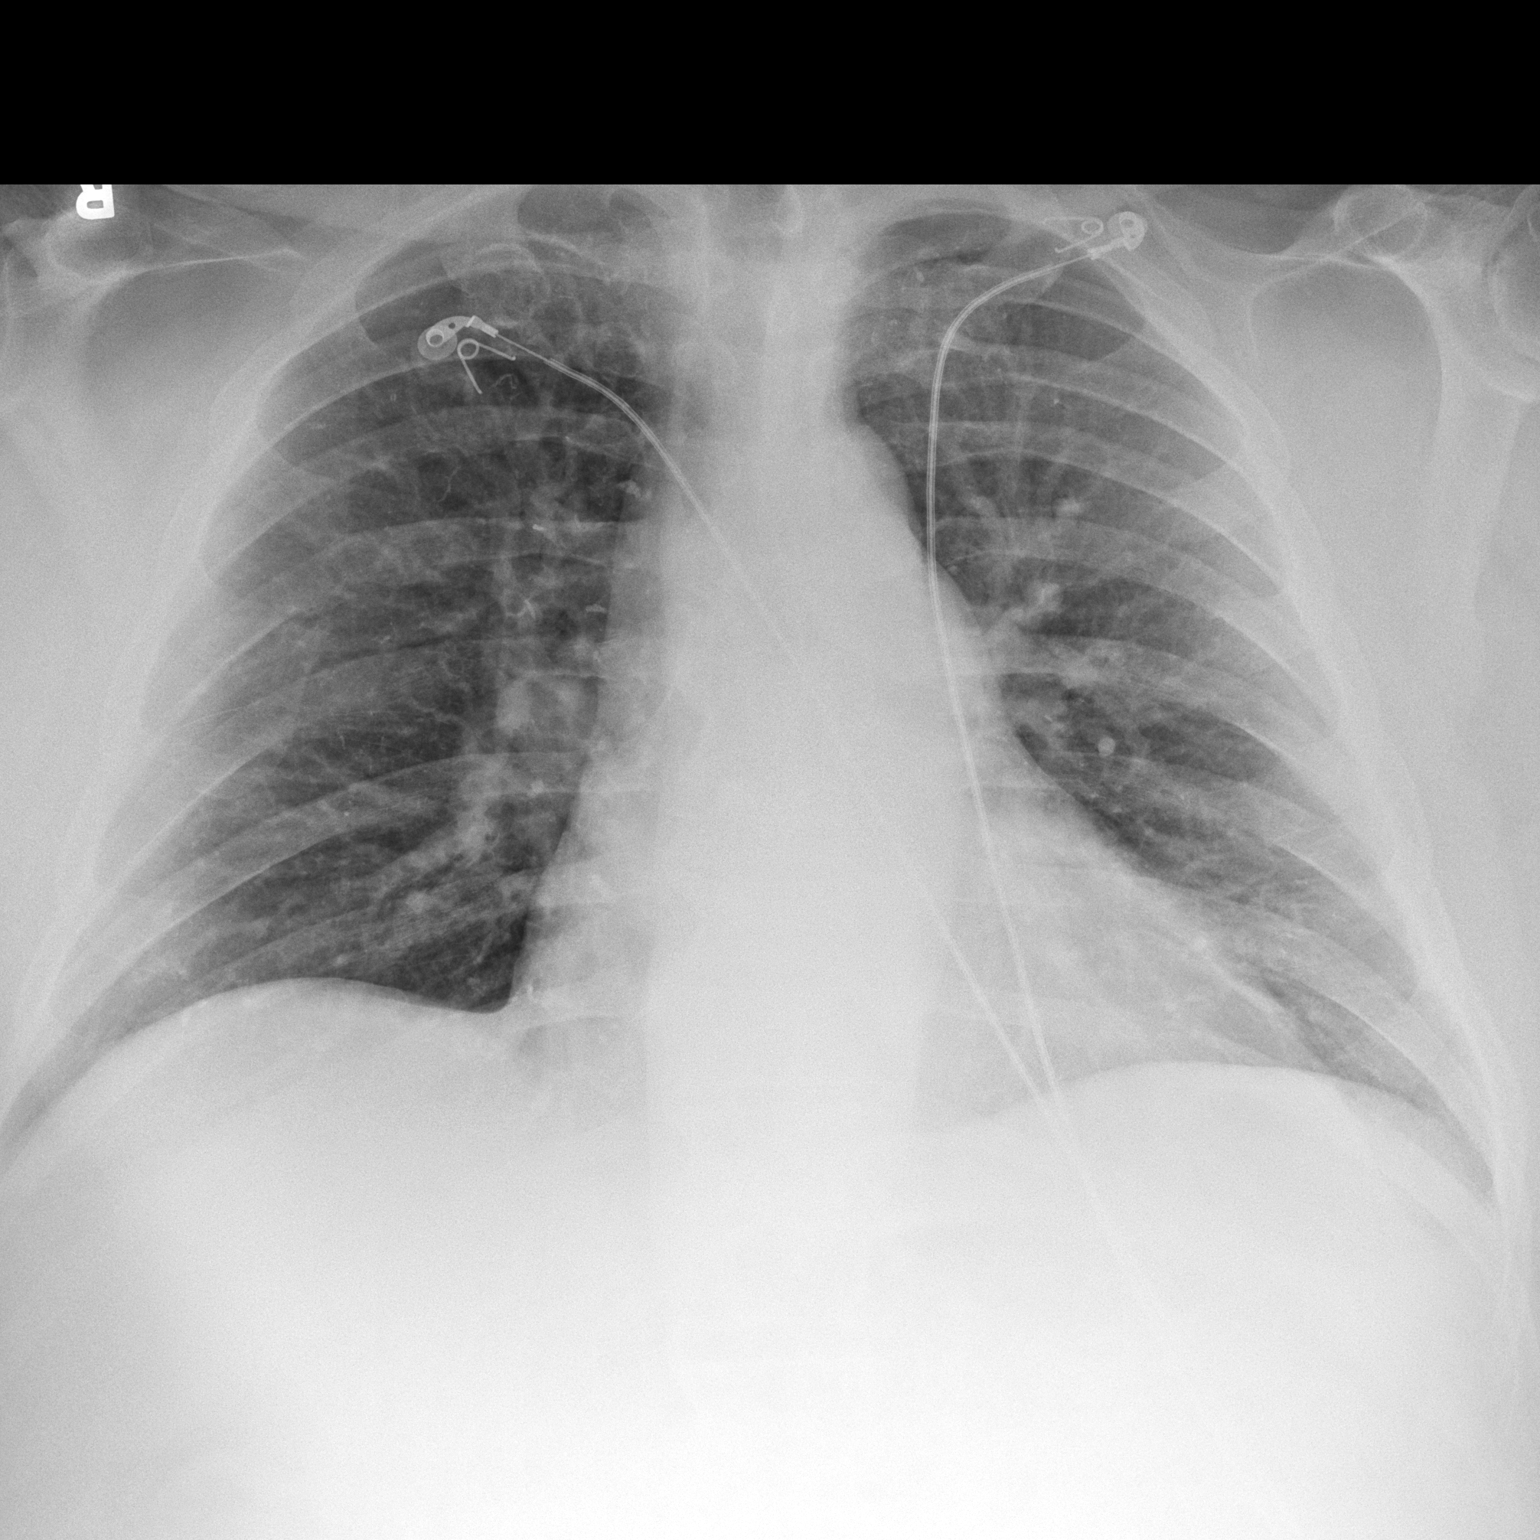
[im 2/2]
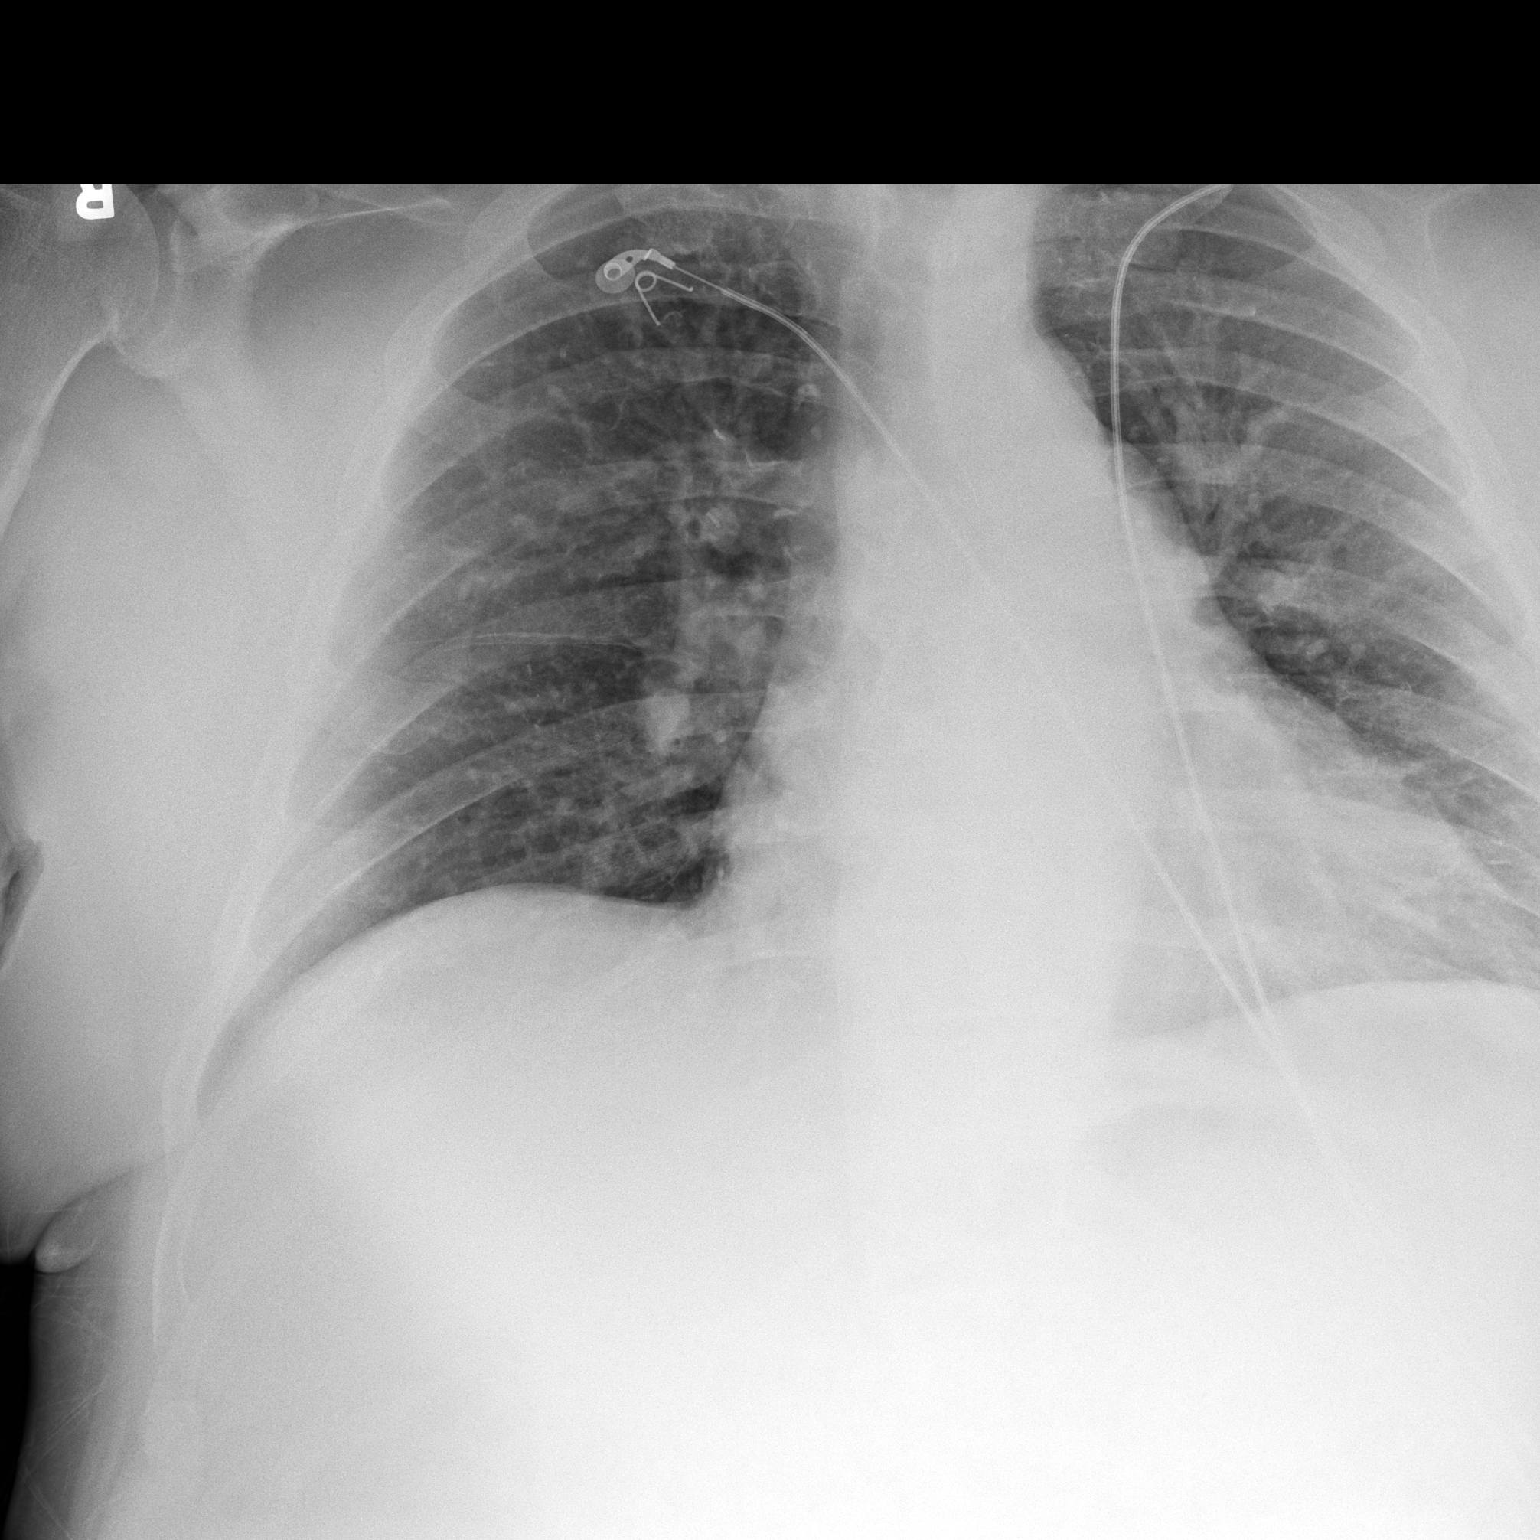

[2 of 2 positions shown; findings below may reference images not displayed]

FINDINGS: Frontal chest radiograph.  Normal heart size and mediastinal contours.  Lungs and pleural spaces are clear.  No pneumothorax.  The osseous structures are grossly intact.
IMPRESSION: No acute cardiopulmonary process
Location 1

## 2020-12-18 IMAGING — DX XR KNEE 2 VIEWS RIGHT
1 series · 2 of 2 positions shown · non-contrast
Comparison: none

Post-op knee
FINAL REPORT:
RIGHT KNEE RADIOGRAPH
INDICATION: post op

[Series 6504: AP · right · 0.10mm/px · 2 of 2 slices shown]
[im 1/2]
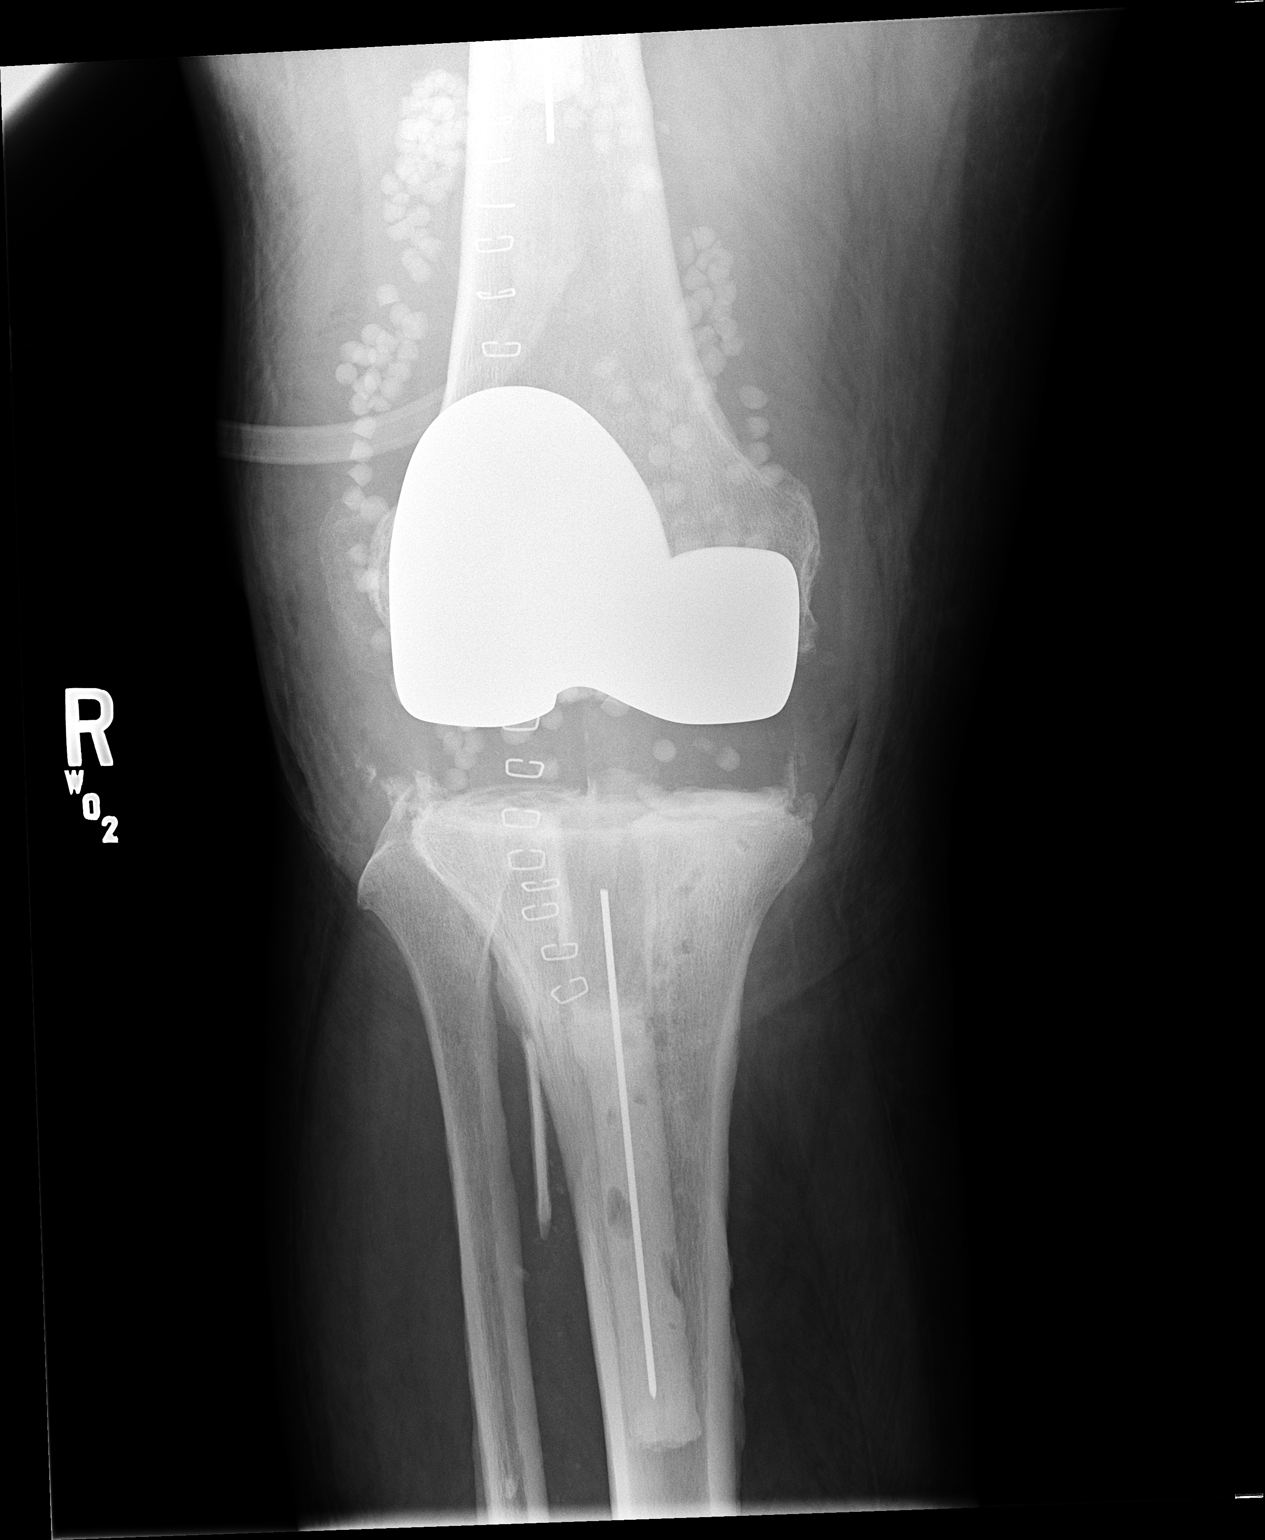
[im 2/2]
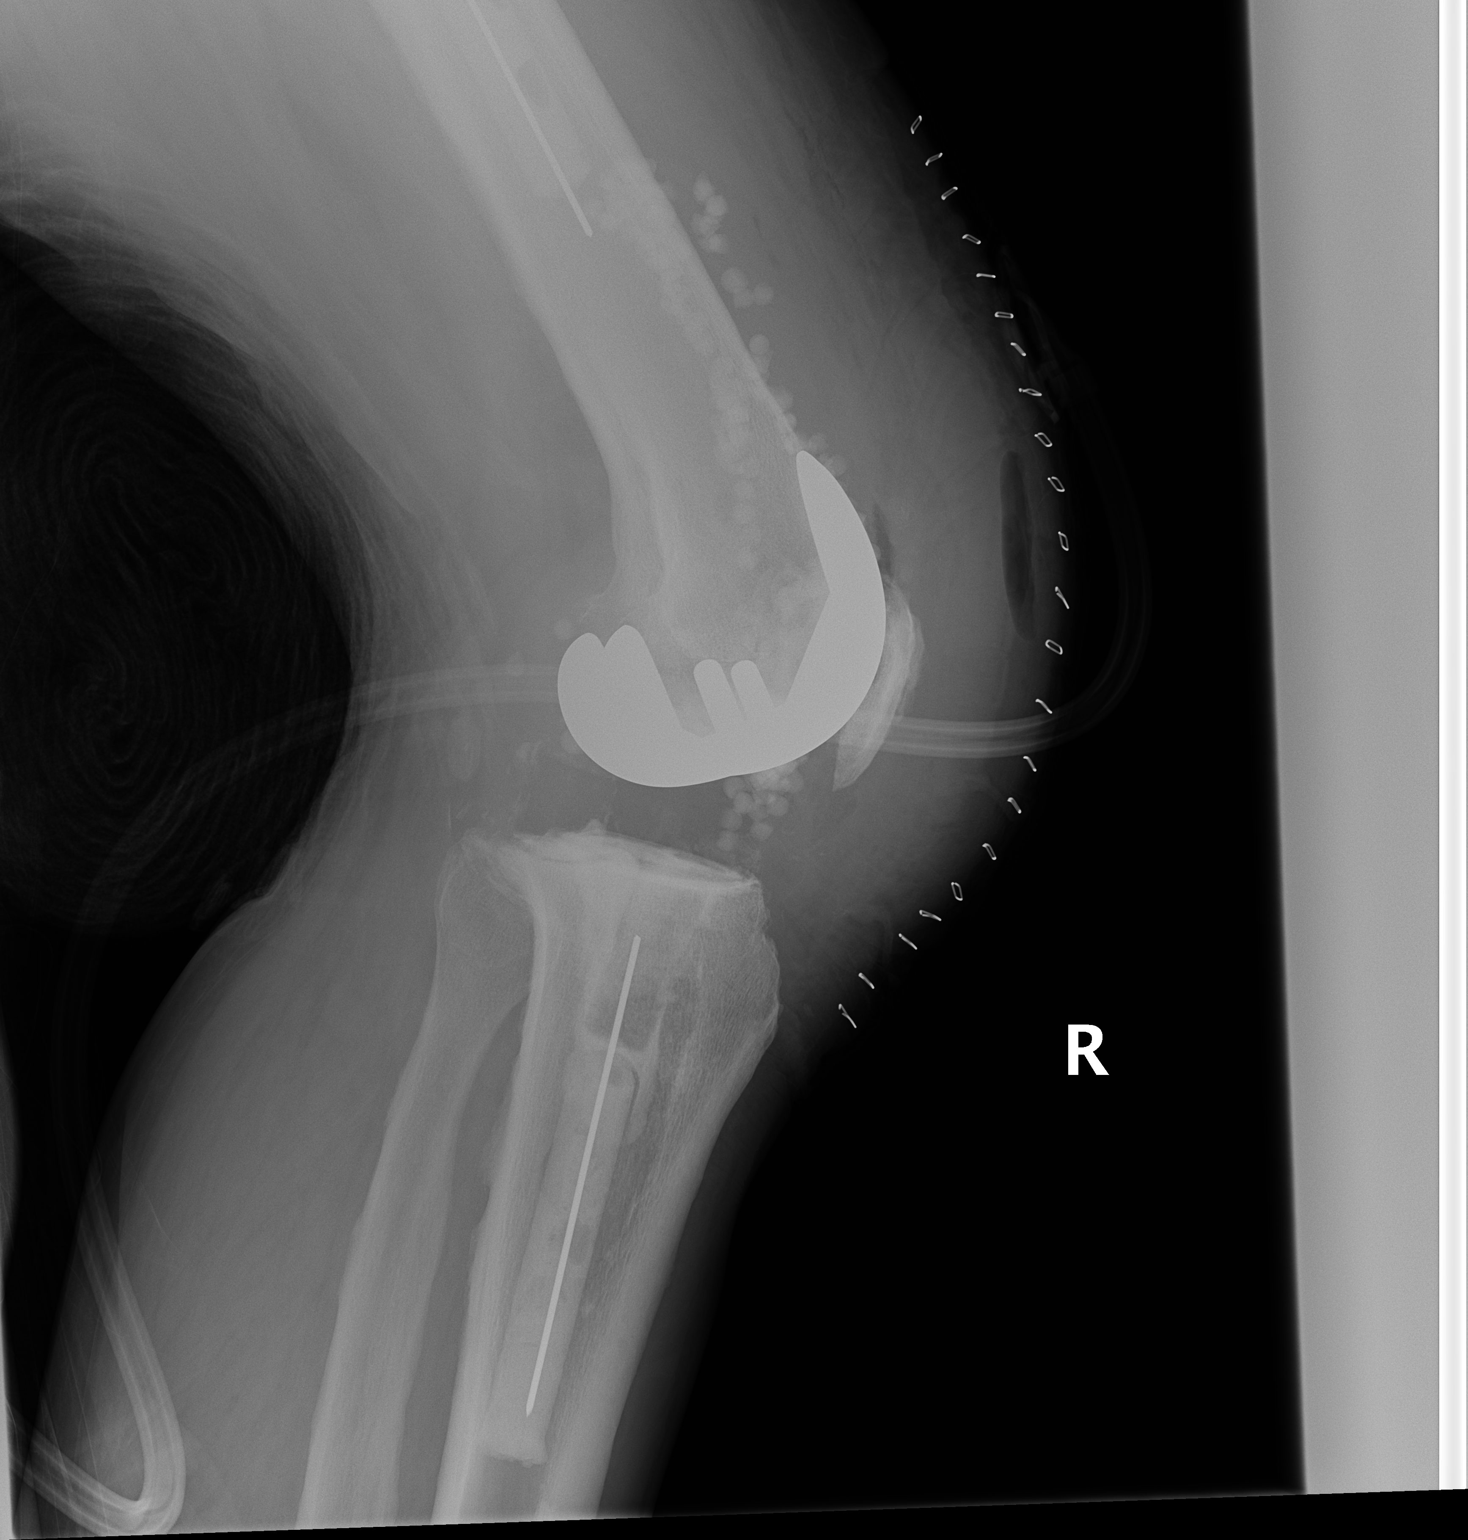

[2 of 2 positions shown; findings below may reference images not displayed]

FINDINGS: AP and lateral views of the right knee obtained and comparison made with 12/13/2020 and shows patient to have undergone visual changes of total right knee arthroplasty. Innumerable rounded hyperdensities suggestive of antibiotic beads therapy. The implant components appear well seated with appropriate alignment. No acute fracture. Expected postoperative appearance of the soft tissues.
IMPRESSION: Status post revision normal total right knee arthroplasty with antibiotic therapy.
Normal alignment.
No findings to suggest acute fractures.
Location code: 1

## 2020-12-23 MED ORDER — OMEPRAZOLE 20 MG CAP, DELAYED RELEASE
20 mg | ORAL_CAPSULE | ORAL | 1 refills | Status: DC
Start: 2020-12-23 — End: 2021-03-05

## 2021-03-05 ENCOUNTER — Ambulatory Visit: Attending: Internal Medicine | Primary: Internal Medicine

## 2021-03-05 ENCOUNTER — Ambulatory Visit
Admit: 2021-03-05 | Discharge: 2021-03-05 | Payer: BLUE CROSS/BLUE SHIELD | Attending: Internal Medicine | Primary: Internal Medicine

## 2021-03-05 DIAGNOSIS — Z0000 Encounter for general adult medical examination without abnormal findings: Secondary | ICD-10-CM

## 2021-03-05 MED ORDER — FAMOTIDINE 40 MG TAB
40 mg | ORAL_TABLET | Freq: Every day | ORAL | 3 refills | Status: DC
Start: 2021-03-05 — End: 2021-04-22

## 2021-03-05 NOTE — Progress Notes (Signed)
HISTORY OF PRESENT ILLNESS  Jay Lee is a 60 y.o. male.  HPI   ASSESSMENT  1. Preventive-   DUE- labs, Shingrix, Tdap  UP TO DATE- 3 covid vax, colonoscopy    2. Chronic- labs due for cholesterol. Change to  Famotidine for a trial.     Review of Systems   Constitutional: Negative for weight loss.   Respiratory: Negative.    Cardiovascular: Negative for chest pain, palpitations, leg swelling and PND.   Gastrointestinal: Positive for heartburn. Negative for blood in stool, constipation, diarrhea and melena.        Mild, only with missing med   Genitourinary: Negative.    Musculoskeletal: Negative for myalgias.   Neurological: Negative for focal weakness.       Physical Exam  Vitals and nursing note reviewed.   Constitutional:       General: He is not in acute distress.     Appearance: He is well-developed.   HENT:      Head: Normocephalic and atraumatic.      Right Ear: Tympanic membrane, ear canal and external ear normal.      Left Ear: Tympanic membrane, ear canal and external ear normal.   Eyes:      General:         Right eye: No discharge.         Left eye: No discharge.      Pupils: Pupils are equal, round, and reactive to light.   Neck:      Thyroid: No thyromegaly.      Vascular: No carotid bruit.   Cardiovascular:      Rate and Rhythm: Normal rate and regular rhythm.      Heart sounds: Normal heart sounds. No murmur heard.  No friction rub. No gallop.    Pulmonary:      Effort: Pulmonary effort is normal. No respiratory distress.      Breath sounds: Normal breath sounds. No wheezing or rales.   Abdominal:      General: Bowel sounds are normal. There is no distension.      Palpations: Abdomen is soft. There is no mass.      Tenderness: There is no abdominal tenderness. There is no guarding or rebound.   Musculoskeletal:         General: No tenderness. Normal range of motion.      Cervical back: Normal range of motion and neck supple.   Lymphadenopathy:      Cervical: No cervical adenopathy.   Skin:      General: Skin is warm and dry.      Findings: No rash.   Neurological:      Mental Status: He is alert and oriented to person, place, and time.      Deep Tendon Reflexes: Reflexes are normal and symmetric.   Psychiatric:         Behavior: Behavior normal.         ASSESSMENT and PLAN  Diagnoses and all orders for this visit:    1. Routine general medical examination at a health care facility  -     CBC WITH AUTOMATED DIFF; Future  -     METABOLIC PANEL, COMPREHENSIVE; Future  -     LIPID PANEL; Future  -     TSH 3RD GENERATION; Future  -     URINALYSIS W/ RFLX MICROSCOPIC; Future    2. Screening for prostate cancer  -     PSA SCREENING (SCREENING);  Future    3. Screen for colon cancer    4. Mixed hyperlipidemia    5. Gastroesophageal reflux disease without esophagitis  -     famotidine (PEPCID) 40 mg tablet; Take 1 Tablet by mouth daily. Replaces omeprazole    6. Encounter for immunization  -     TDAP, BOOSTRIX, (AGE 48 YRS+), IM

## 2021-03-05 NOTE — Progress Notes (Signed)
See my chart note

## 2021-03-06 LAB — LIPID PANEL
Cholesterol, Total: 225 mg/dL — ABNORMAL HIGH (ref 100–199)
Cholesterol, total: 225 mg/dL — ABNORMAL HIGH (ref 100–199)
HDL Cholesterol: 59 mg/dL (ref >39)
HDL: 59 mg/dL (ref >39)
LDL Calculated: 155 mg/dL — ABNORMAL HIGH (ref 0–99)
LDL, calculated: 155 mg/dL — ABNORMAL HIGH (ref 0–99)
Triglyceride: 63 mg/dL (ref 0–149)
Triglycerides: 63 mg/dL (ref 0–149)
VLDL, calculated: 11 mg/dL (ref 5–40)
VLDL: 11 mg/dL (ref 5–40)

## 2021-03-06 LAB — CBC WITH AUTO DIFFERENTIAL
Basophils %: 1 %
Basophils Absolute: 0.1 10*3/uL (ref 0.0–0.2)
Eosinophils %: 3 %
Eosinophils Absolute: 0.2 10*3/uL (ref 0.0–0.4)
Granulocyte Absolute Count: 0 10*3/uL (ref 0.0–0.1)
Hematocrit: 44.3 % (ref 37.5–51.0)
Hemoglobin: 15 g/dL (ref 13.0–17.7)
Immature Granulocytes: 0 %
Lymphocytes %: 34 %
Lymphocytes Absolute: 1.8 10*3/uL (ref 0.7–3.1)
MCH: 30.4 pg (ref 26.6–33.0)
MCHC: 33.9 g/dL (ref 31.5–35.7)
MCV: 90 fL (ref 79–97)
Monocytes %: 7 %
Monocytes Absolute: 0.4 10*3/uL (ref 0.1–0.9)
Neutrophils %: 55 %
Neutrophils Absolute: 2.8 10*3/uL (ref 1.4–7.0)
Platelets: 238 10*3/uL (ref 150–450)
RBC: 4.94 x10E6/uL (ref 4.14–5.80)
RDW: 13.1 % (ref 11.6–15.4)
WBC: 5.2 10*3/uL (ref 3.4–10.8)

## 2021-03-06 LAB — COMPREHENSIVE METABOLIC PANEL
ALT: 16 IU/L (ref 0–44)
AST: 20 IU/L (ref 0–40)
Albumin/Globulin Ratio: 2.3 NA — ABNORMAL HIGH (ref 1.2–2.2)
Albumin: 5 g/dL — ABNORMAL HIGH (ref 3.8–4.8)
Alkaline Phosphatase: 47 IU/L (ref 44–121)
BUN: 25 mg/dL (ref 8–27)
Bun/Cre Ratio: 26 NA — ABNORMAL HIGH (ref 10–24)
CO2: 21 mmol/L (ref 20–29)
Calcium: 9.6 mg/dL (ref 8.6–10.2)
Chloride: 106 mmol/L (ref 96–106)
Creatinine: 0.96 mg/dL (ref 0.76–1.27)
Est, Glomerular Filtration Rate: 90 mL/min/{1.73_m2} (ref >59)
Globulin, Total: 2.2 g/dL (ref 1.5–4.5)
Glucose: 93 mg/dL (ref 65–99)
Potassium: 4.5 mmol/L (ref 3.5–5.2)
Sodium: 145 mmol/L — ABNORMAL HIGH (ref 134–144)
Total Bilirubin: 0.5 mg/dL (ref 0.0–1.2)
Total Protein: 7.2 g/dL (ref 6.0–8.5)

## 2021-03-06 LAB — URINALYSIS W/ RFLX MICROSCOPIC
Bilirubin, Urine: NEGATIVE
Bilirubin: NEGATIVE
Blood, Urine: NEGATIVE
Blood: NEGATIVE
Glucose, UA: NEGATIVE
Glucose: NEGATIVE
Ketone: NEGATIVE
Ketones, Urine: NEGATIVE
Leukocyte Esterase, Urine: NEGATIVE
Leukocyte Esterase: NEGATIVE
Nitrite, Urine: NEGATIVE
Nitrites: NEGATIVE
Specific Gravity, UA: >=1.03 — AB (ref 1.005–1.030)
Specific Gravity: >=1.03 — AB (ref 1.005–1.030)
Urobilinogen, Urine: 0.2 mg/dL (ref 0.2–1.0)
Urobilinogen: 0.2 mg/dL (ref 0.2–1.0)
pH (UA): 6.5 (ref 5.0–7.5)
pH, UA: 6.5 NA (ref 5.0–7.5)

## 2021-03-06 LAB — TSH 3RD GENERATION
TSH: 4.99 u[IU]/mL — ABNORMAL HIGH (ref 0.450–4.500)
TSH: 4.99 u[IU]/mL — ABNORMAL HIGH (ref 0.450–4.500)

## 2021-03-06 LAB — PSA SCREENING: PSA: 1.1 ng/mL (ref 0.0–4.0)

## 2021-03-06 LAB — CBC WITH AUTOMATED DIFF
ABS. BASOPHILS: 0.1 10*3/uL (ref 0.0–0.2)
ABS. EOSINOPHILS: 0.2 10*3/uL (ref 0.0–0.4)
ABS. IMM. GRANS.: 0 10*3/uL (ref 0.0–0.1)
ABS. MONOCYTES: 0.4 10*3/uL (ref 0.1–0.9)
ABS. NEUTROPHILS: 2.8 10*3/uL (ref 1.4–7.0)
Abs Lymphocytes: 1.8 10*3/uL (ref 0.7–3.1)
BASOPHILS: 1 %
EOSINOPHILS: 3 %
HCT: 44.3 % (ref 37.5–51.0)
HGB: 15 g/dL (ref 13.0–17.7)
IMMATURE GRANULOCYTES: 0 %
Lymphocytes: 34 %
MCH: 30.4 pg (ref 26.6–33.0)
MCHC: 33.9 g/dL (ref 31.5–35.7)
MCV: 90 fL (ref 79–97)
MONOCYTES: 7 %
NEUTROPHILS: 55 %
PLATELET: 238 10*3/uL (ref 150–450)
RBC: 4.94 x10E6/uL (ref 4.14–5.80)
RDW: 13.1 % (ref 11.6–15.4)
WBC: 5.2 10*3/uL (ref 3.4–10.8)

## 2021-03-06 LAB — METABOLIC PANEL, COMPREHENSIVE
A-G Ratio: 2.3 — ABNORMAL HIGH (ref 1.2–2.2)
ALT (SGPT): 16 IU/L (ref 0–44)
AST (SGOT): 20 IU/L (ref 0–40)
Albumin: 5 g/dL — ABNORMAL HIGH (ref 3.8–4.8)
Alk. phosphatase: 47 IU/L (ref 44–121)
BUN/Creatinine ratio: 26 — ABNORMAL HIGH (ref 10–24)
BUN: 25 mg/dL (ref 8–27)
Bilirubin, total: 0.5 mg/dL (ref 0.0–1.2)
CO2: 21 mmol/L (ref 20–29)
Calcium: 9.6 mg/dL (ref 8.6–10.2)
Chloride: 106 mmol/L (ref 96–106)
Creatinine: 0.96 mg/dL (ref 0.76–1.27)
GLOBULIN, TOTAL: 2.2 g/dL (ref 1.5–4.5)
Glucose: 93 mg/dL (ref 65–99)
Potassium: 4.5 mmol/L (ref 3.5–5.2)
Protein, total: 7.2 g/dL (ref 6.0–8.5)
Sodium: 145 mmol/L — ABNORMAL HIGH (ref 134–144)
eGFR: 90 mL/min/{1.73_m2} (ref >59)

## 2021-03-06 LAB — PSA SCREENING (SCREENING): Prostate Specific Ag: 1.1 ng/mL (ref 0.0–4.0)

## 2021-04-02 IMAGING — DX XR KNEE 2 VIEWS RIGHT
1 series · 3 of 3 positions shown · non-contrast
Comparison: none

FINAL REPORT:
INDICATION: Right Total Knee Replacement pain

[Series 729: AP · right · 0.10mm/px · 3 of 3 slices shown]
[im 1/3]
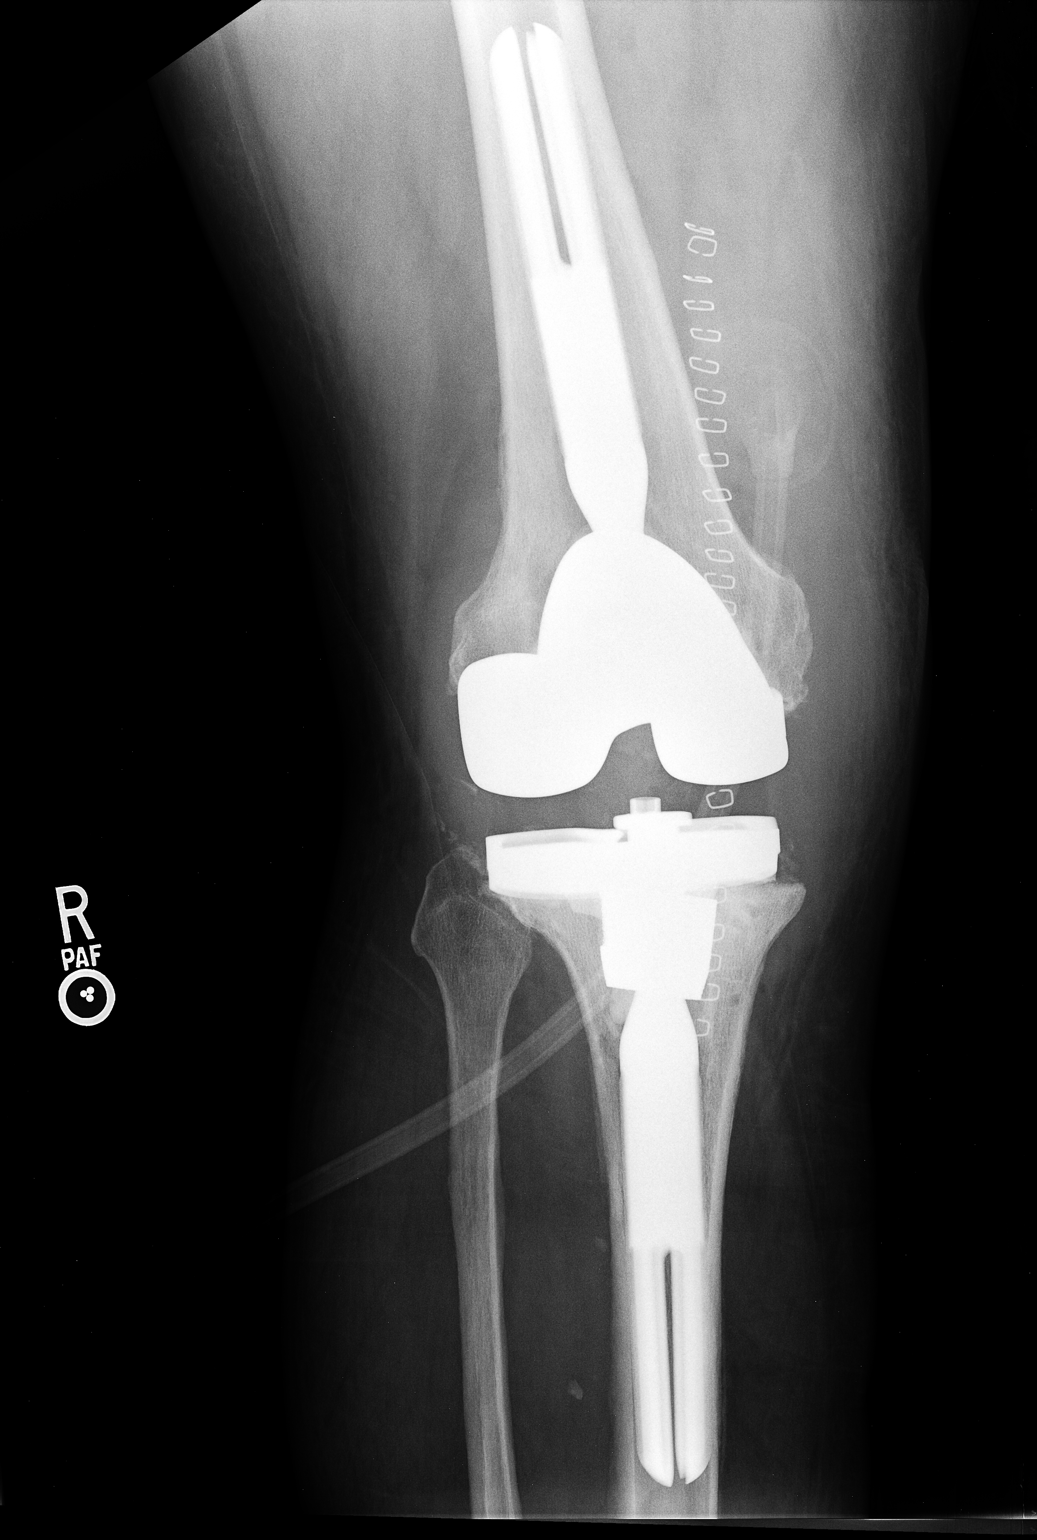
[im 2/3]
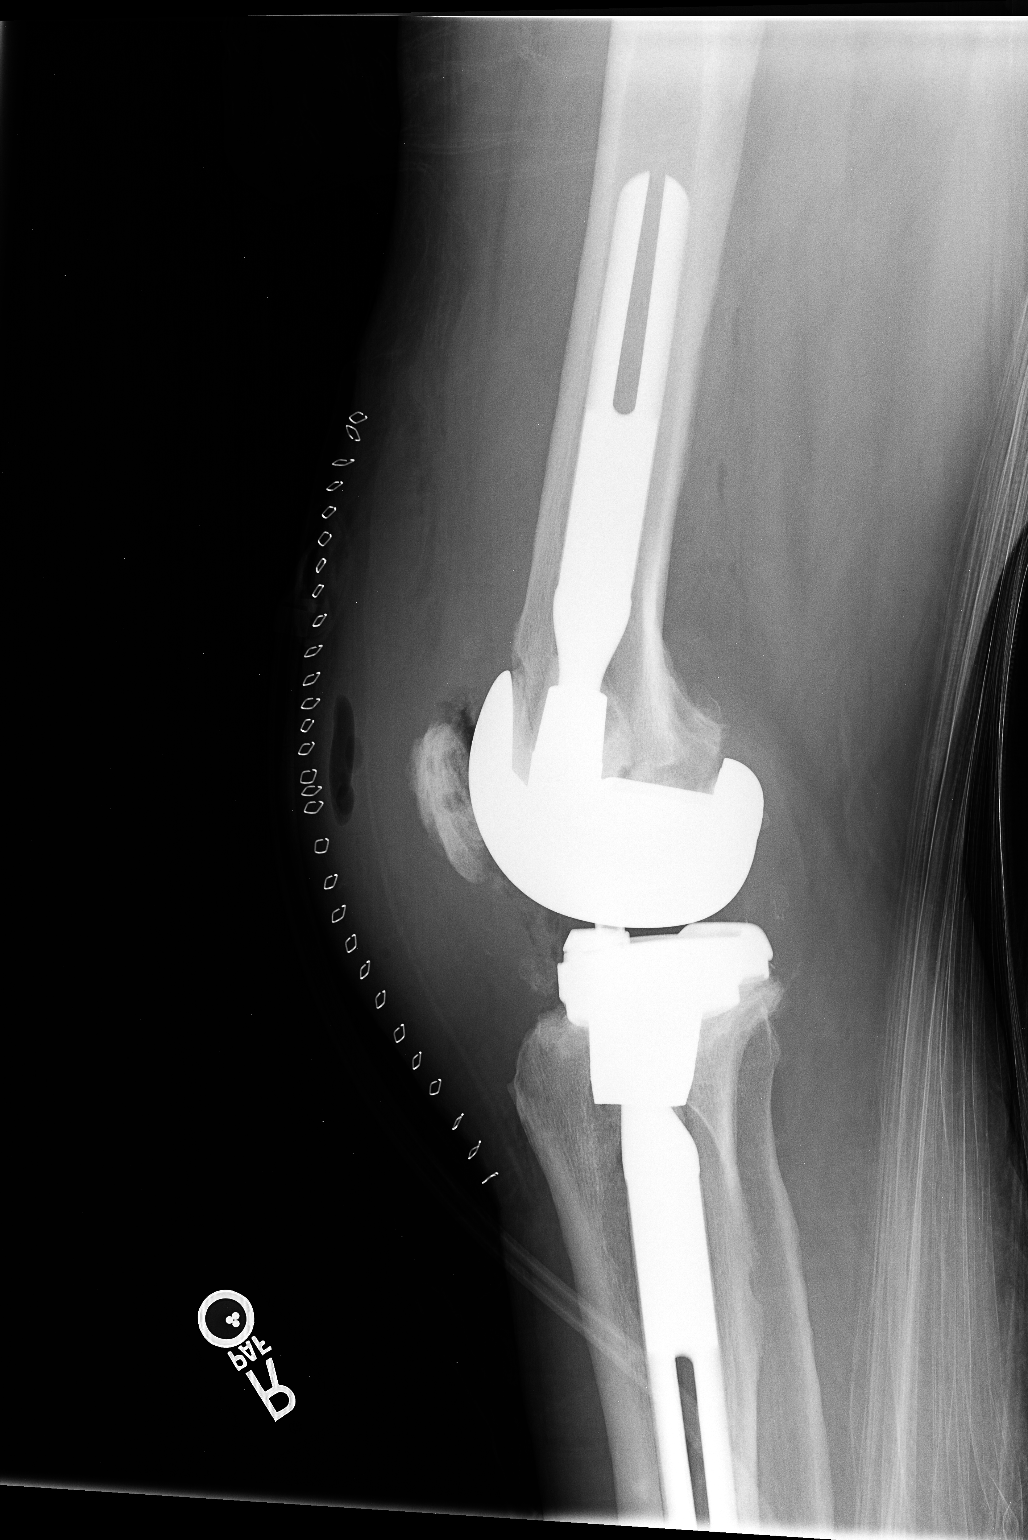
[im 3/3]
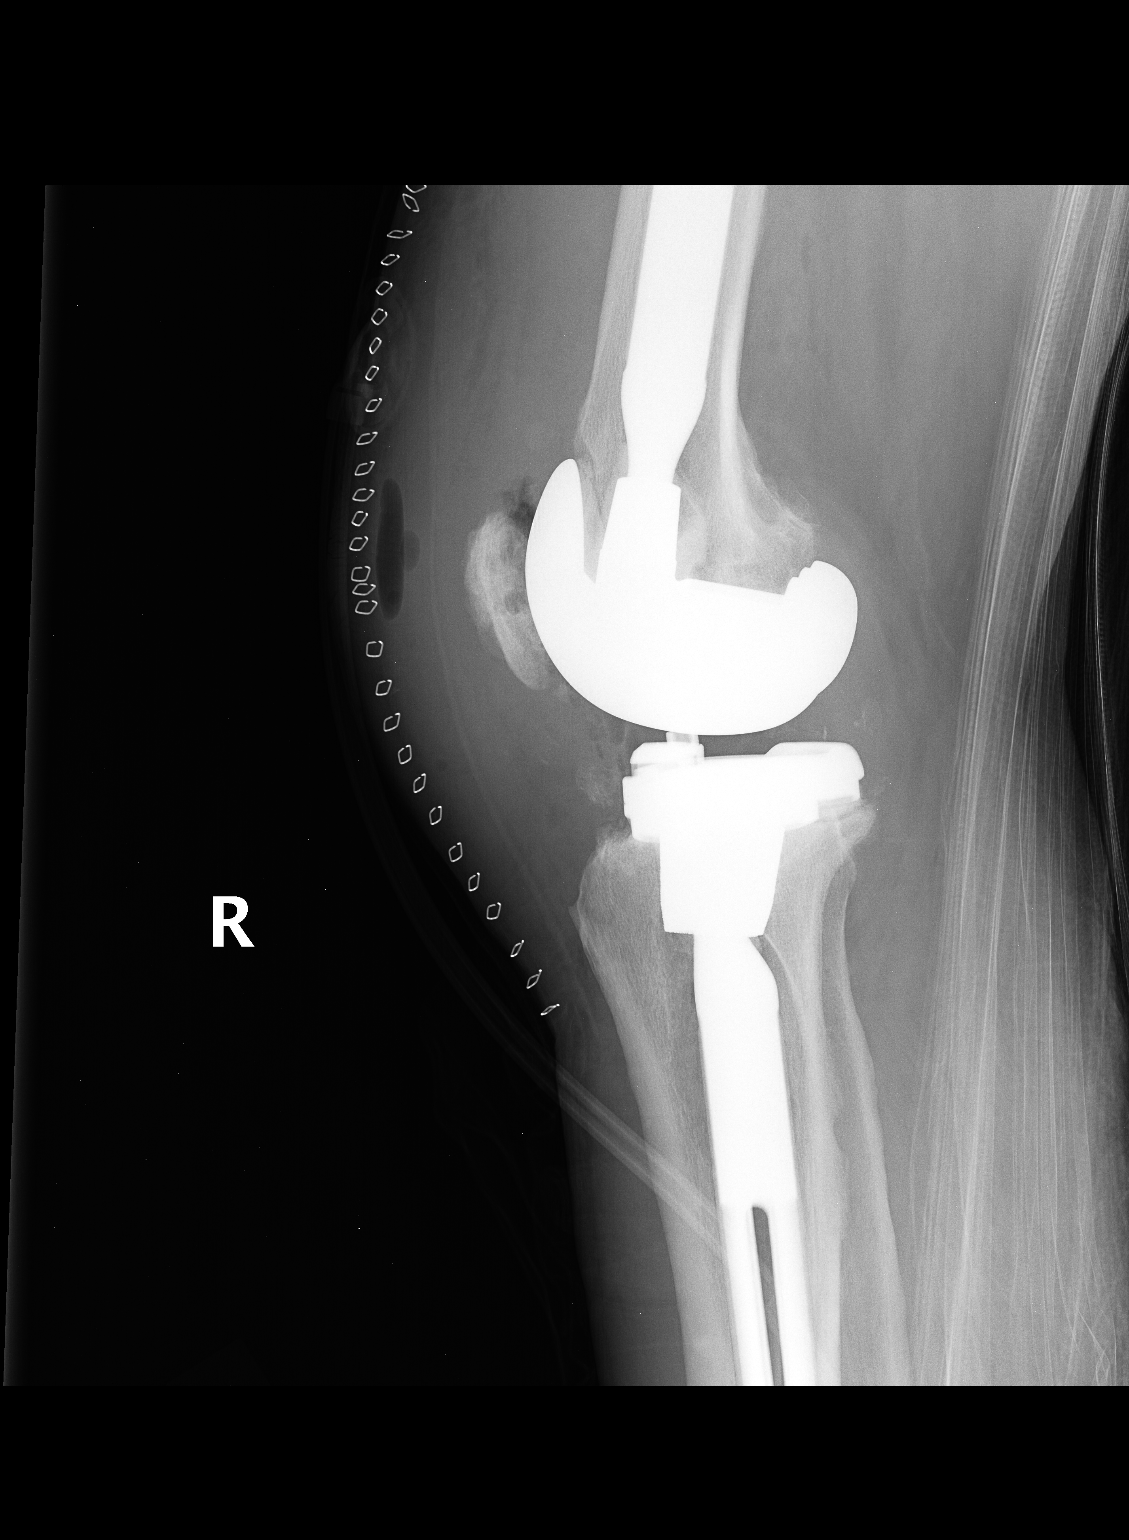

[3 of 3 positions shown; findings below may reference images not displayed]

FINDINGS: 3 views of the right knee
Exam is compared to 12/18/2020. There is redo of a right knee replacement. Hardware is well positioned. This overlying skin staples and soft tissue swelling. There is no definite evidence of acute fracture.
IMPRESSION: Expected findings for right knee replacement.

## 2021-04-06 NOTE — Telephone Encounter (Signed)
-----   Message from Jarold Song Rhodehamel sent at 04/03/2021 12:44 PM EDT -----  Subject: Results Request    QUESTIONS  Results: physical blood work; Ordered by: Sharren Bridge   Date Performed: 2021-03-05  ---------------------------------------------------------------------------  --------------  Jay Lee INFO    [phone]; OK to leave message on voicemail  ---------------------------------------------------------------------------  --------------

## 2021-04-22 MED ORDER — OMEPRAZOLE 20 MG CAP, DELAYED RELEASE
20 mg | ORAL_CAPSULE | Freq: Every day | ORAL | 1 refills | Status: DC
Start: 2021-04-22 — End: 2021-10-26

## 2021-04-22 NOTE — Telephone Encounter (Signed)
Advised pt MD has approved his request to restart Omeprazole 20 mg daily. Sent in 90 with 1 rf to pharmacy.

## 2021-04-22 NOTE — Telephone Encounter (Signed)
Reason for call:  Pt spouse states the new medication is not working and pt would like to know if he can resume omeprazole.  Pt will need a new prescription, he does not have any left    Is this a new problem: yes     Date of last appointment:  03/05/2021     Can we respond via MyChart: no    Best call back number:   Jay, Lee (EC) [phone] [facility])

## 2021-05-14 IMAGING — DX XR ABDOMEN 1 VIEW
1 series · 2 of 2 positions shown · non-contrast
Comparison: none

FINAL REPORT:
INDICATION: Abdominal distension (gaseous)
Upper abdominal pain, unspecified generalized abdominal pain
Exam: Supine AP views of the abdomen

[Series 8390: AP · U · 0.19mm/px · 2 of 2 slices shown]
[im 1/2]
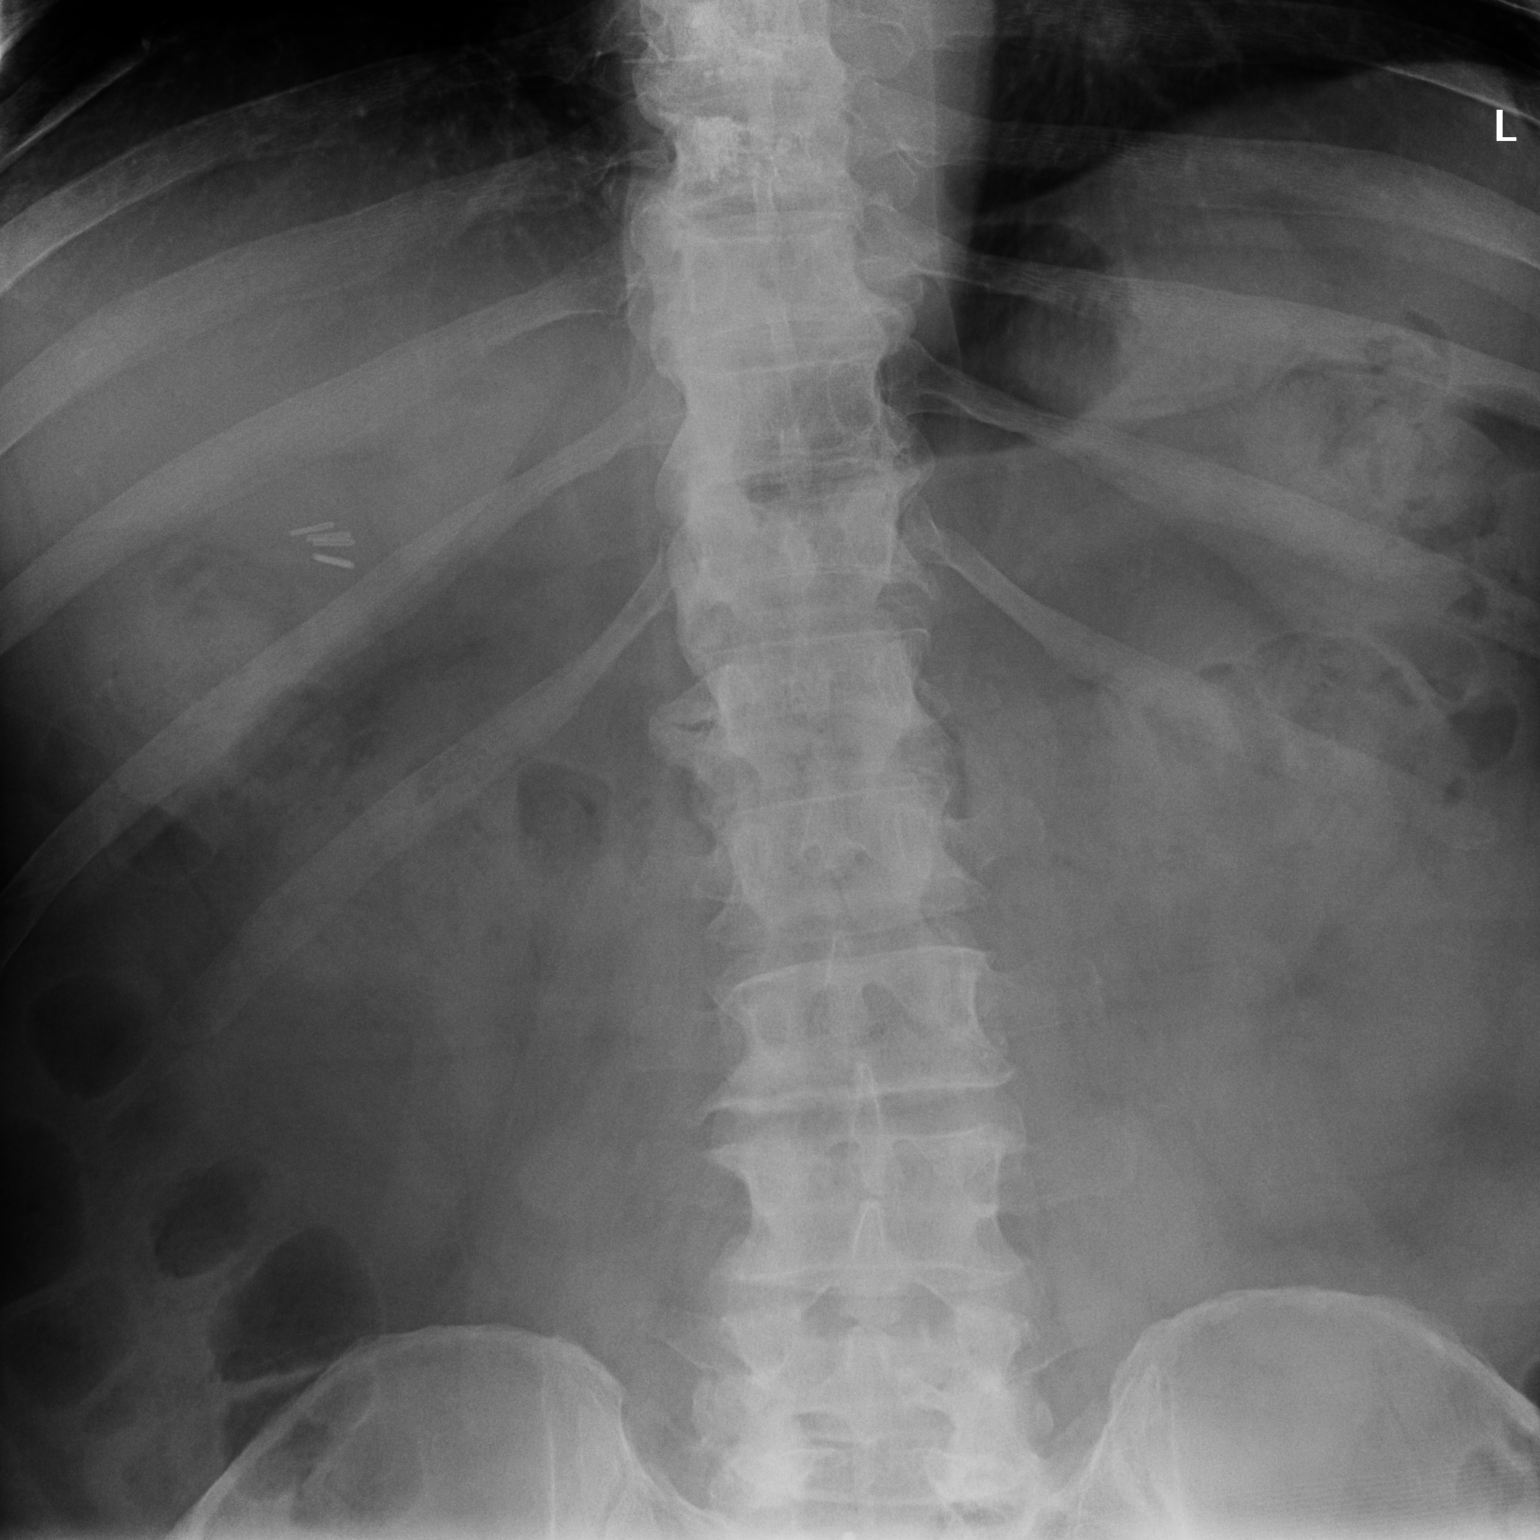
[im 2/2]
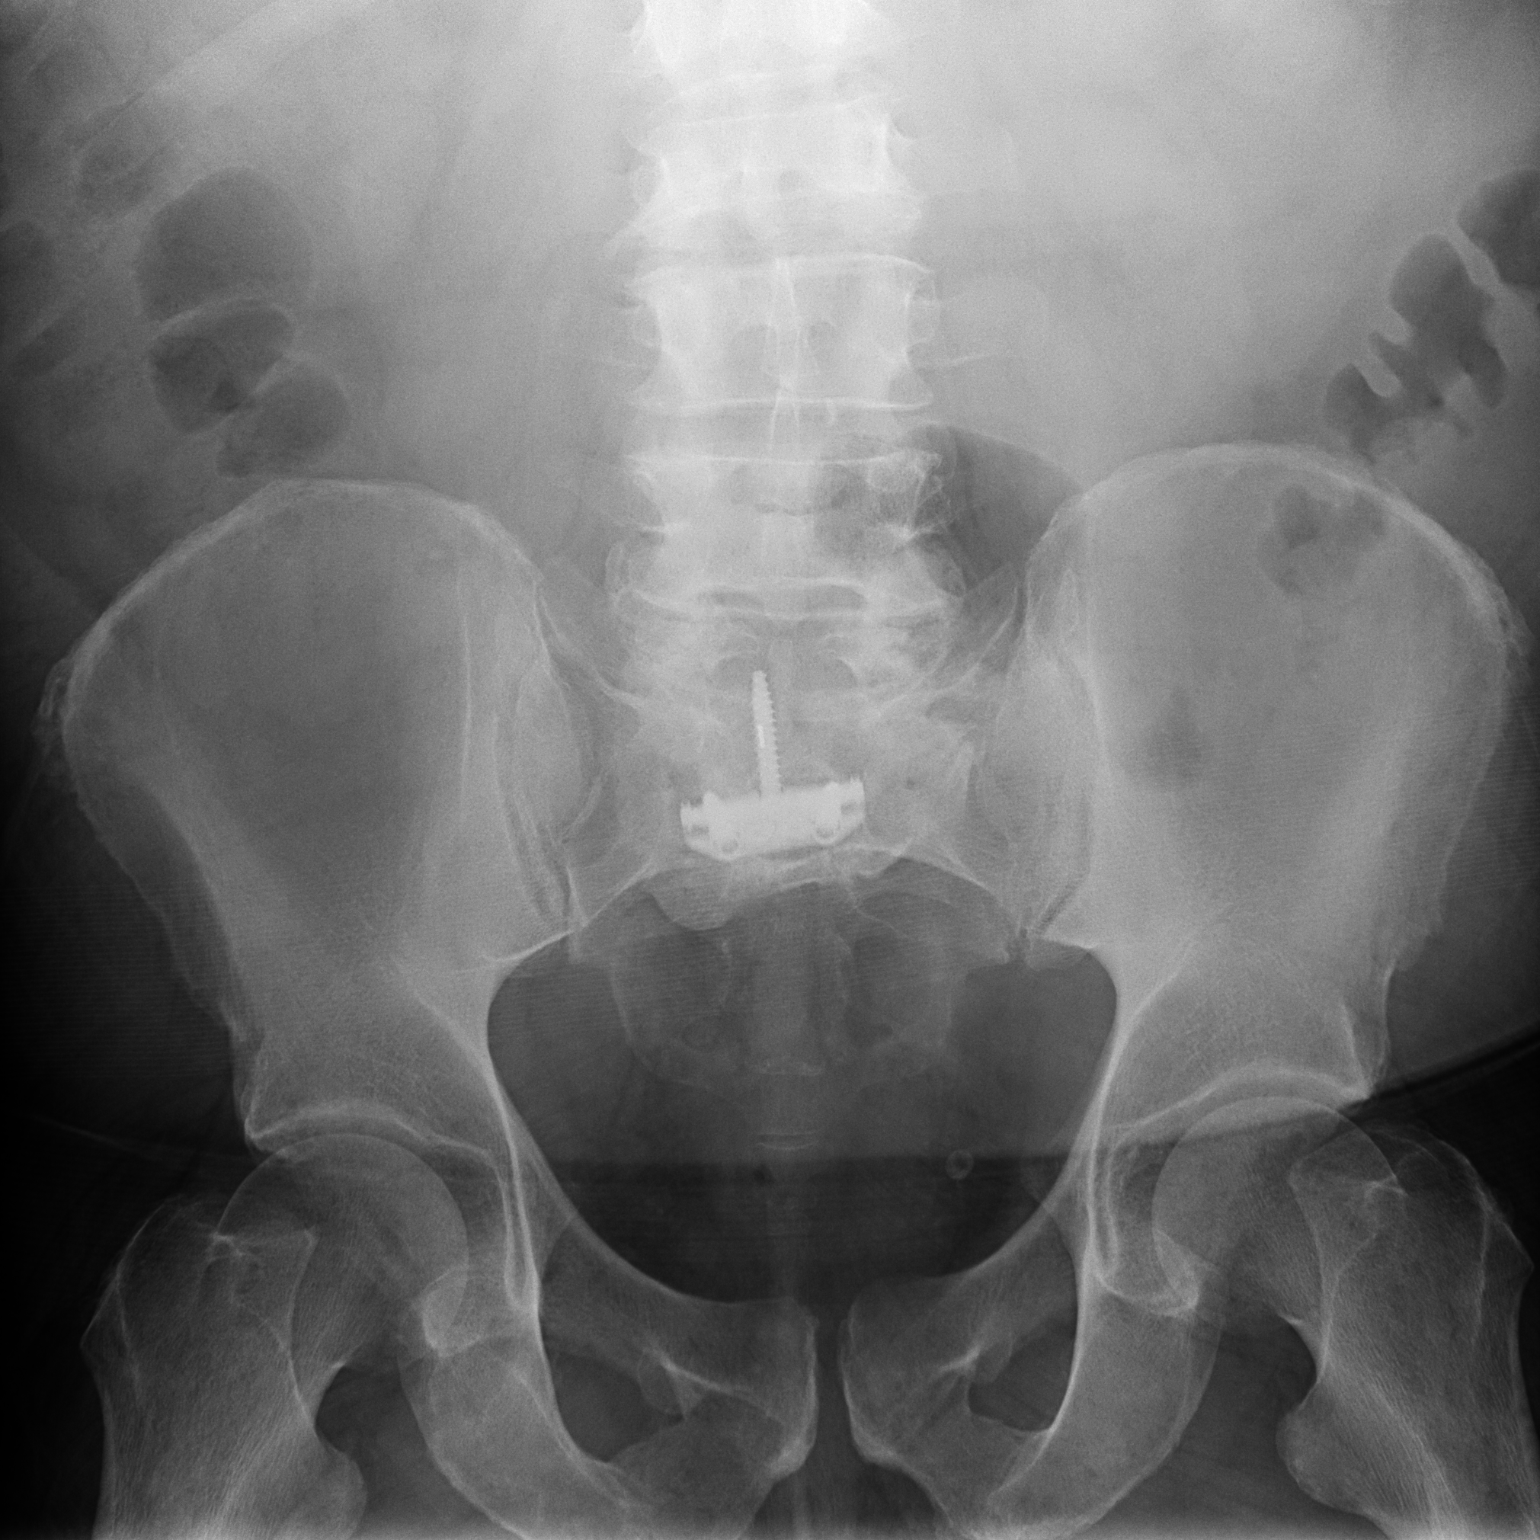

[2 of 2 positions shown; findings below may reference images not displayed]

FINDINGS: There is unremarkable bowel gas pattern. Clips in right upper quadrant. Bony structures demonstrates T8-T9 kyphoplasty suggested. There is hardware in the lower lumbosacral region. There is no definite evidence of abnormal calcification. Phlebolith seen within the pelvis.
IMPRESSION: No acute process.

## 2021-05-14 IMAGING — DX XR CHEST 2 VIEWS
1 series · 3 of 3 positions shown · non-contrast
Comparison: 12/13/2020

FINAL REPORT:
INDICATION: Cough, unspecified
Shortness of breath

[Series 8396: PA · U · 0.19mm/px · 3 of 3 slices shown]
[im 1/3]
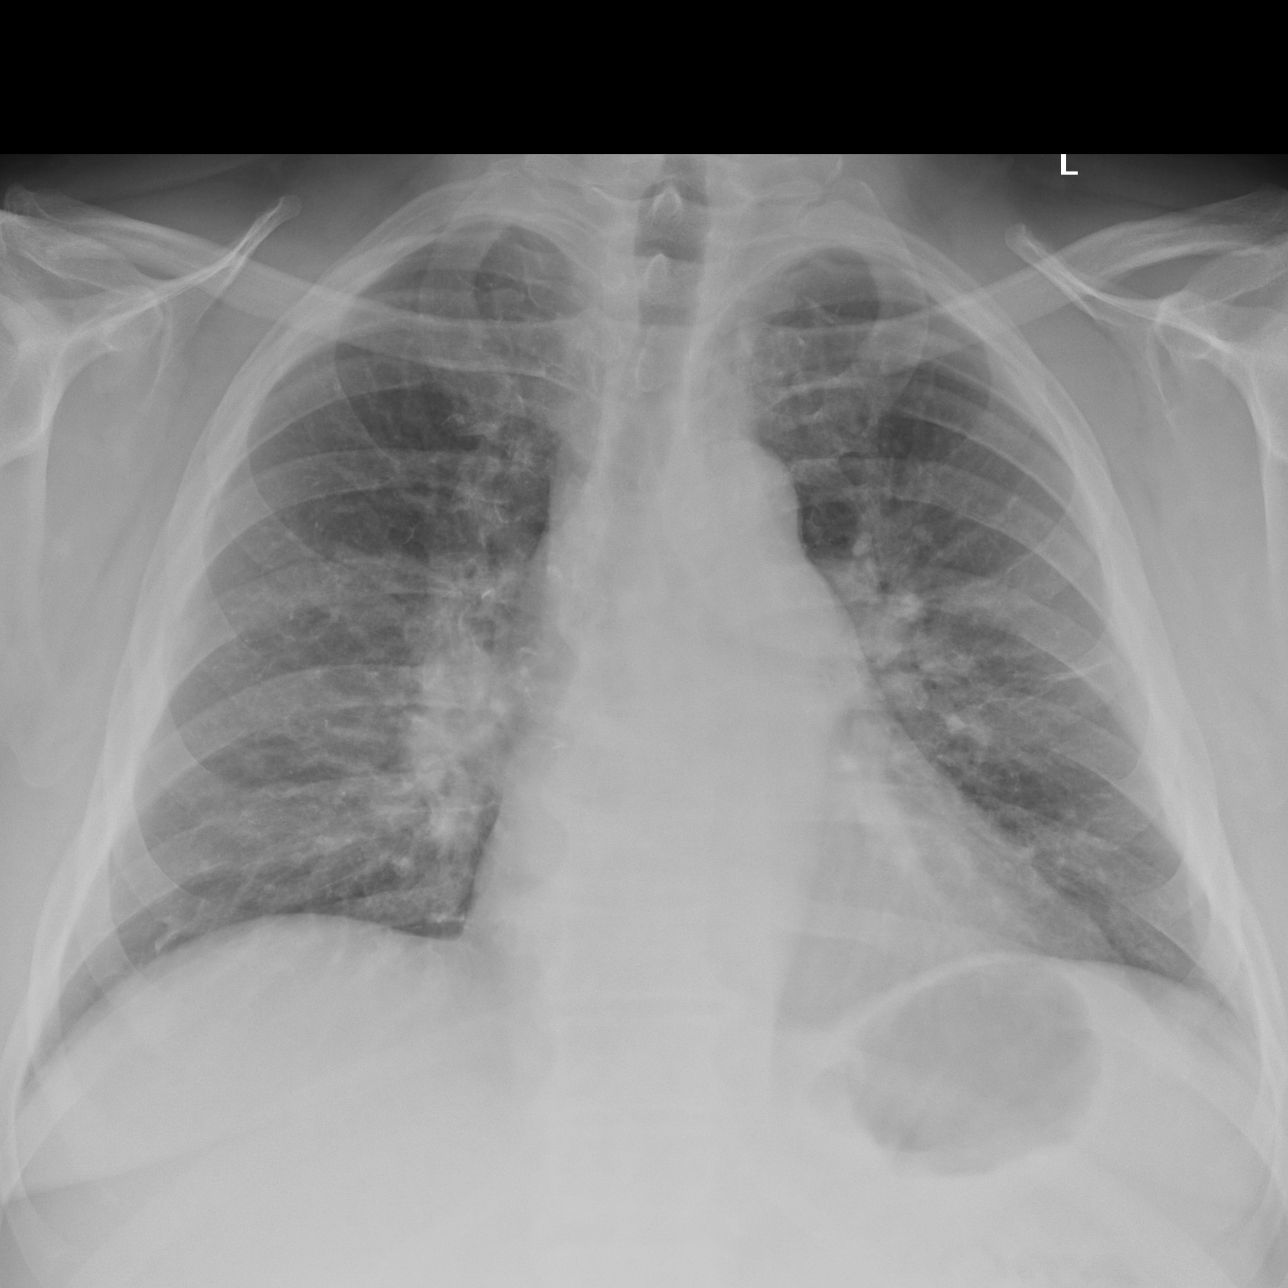
[im 2/3]
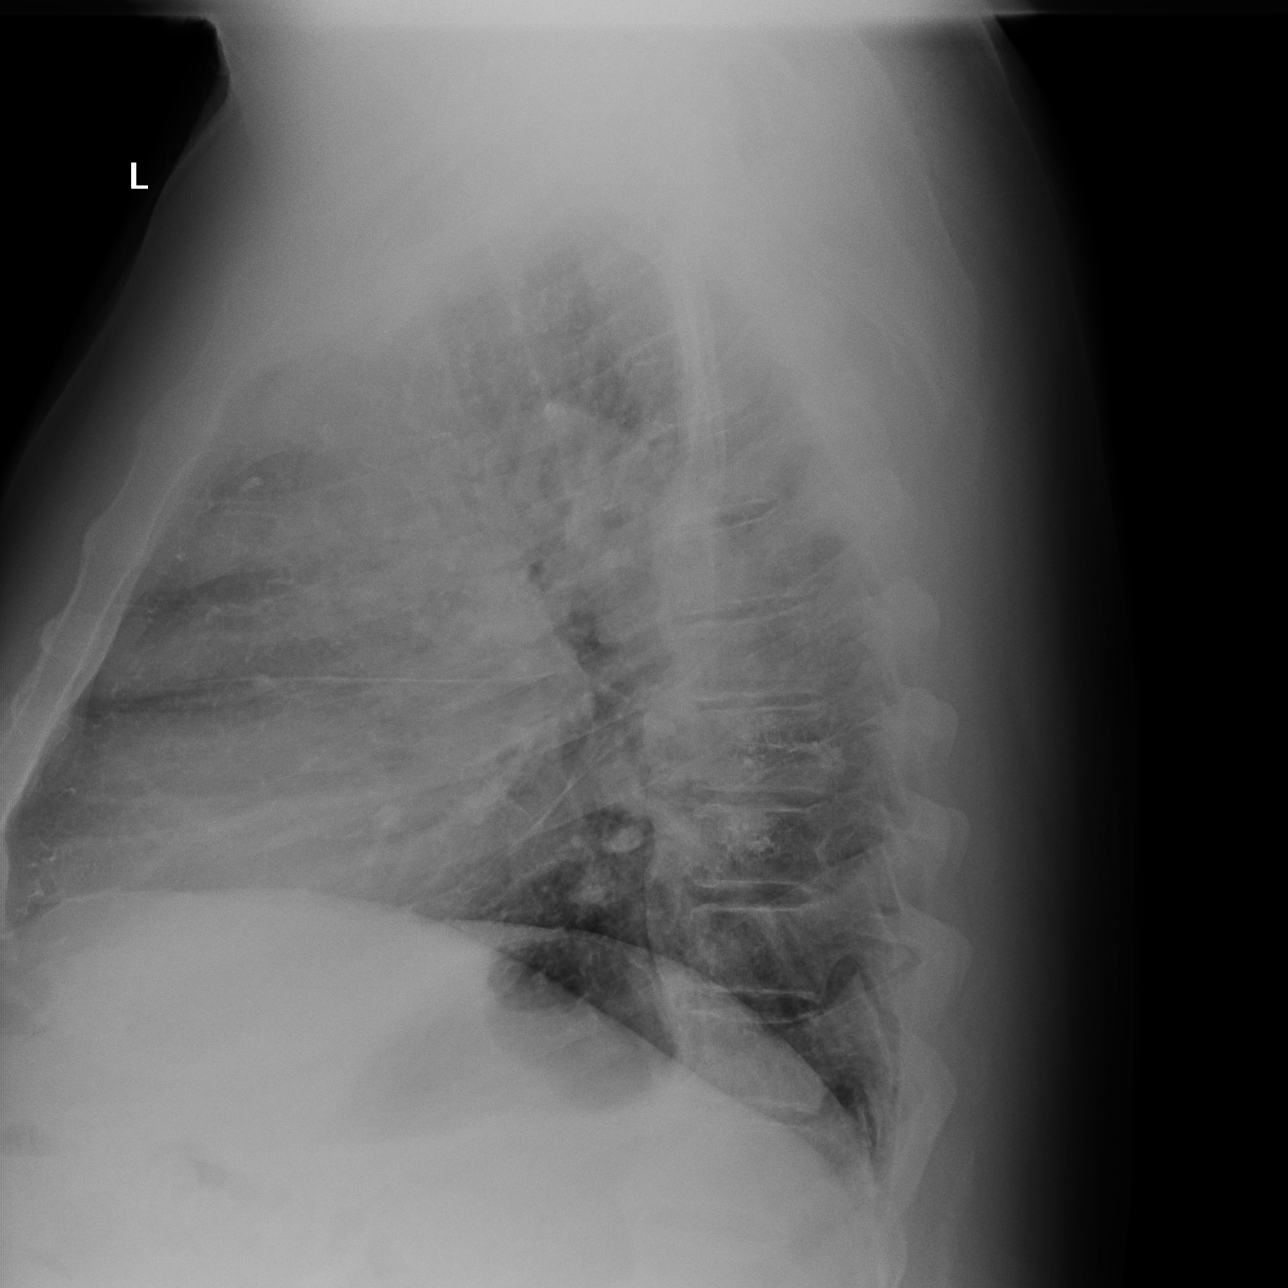
[im 3/3]
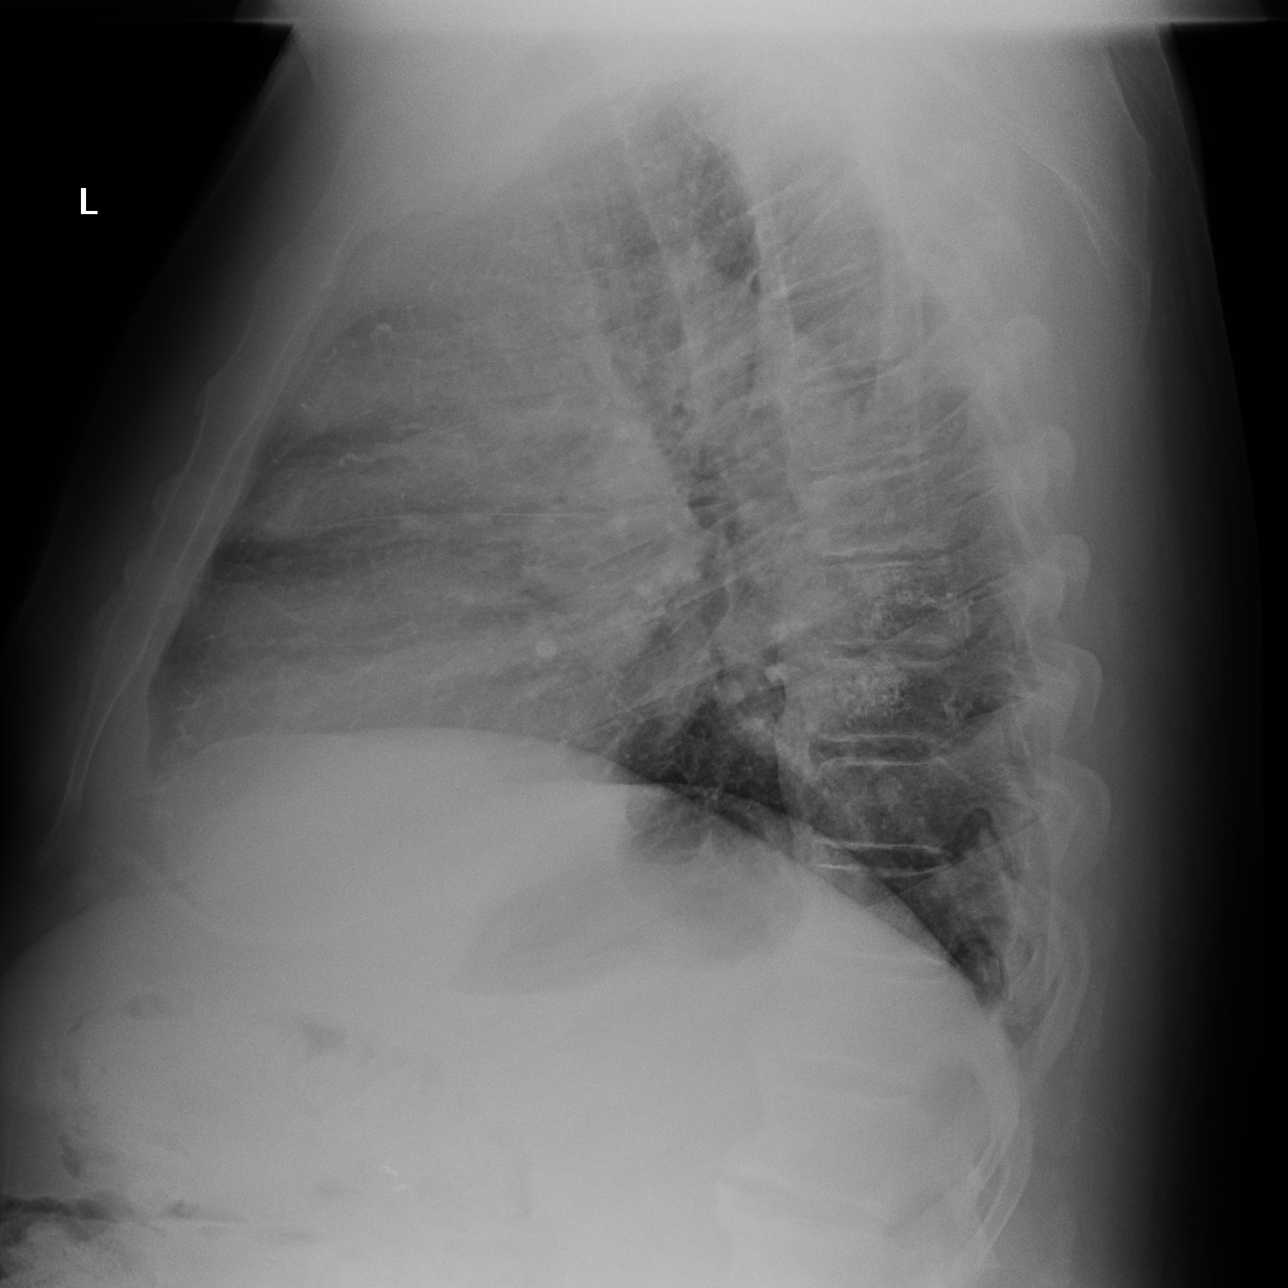

[3 of 3 positions shown; findings below may reference images not displayed]

FINDINGS: 2 views of the chest.
There is interstitial prominence extending from the hilar regions. There is vascular congestion. Heart size is stable. Bony structures demonstrates degenerative disc disease with prominent anterior osteophytes.
IMPRESSION: 1. Vascular congestion with perihilar interstitial prominence correlate for edema versus atypical pneumonia.

## 2021-05-18 NOTE — Telephone Encounter (Signed)
Requested Prescriptions     Pending Prescriptions Disp Refills    fenofibrate (LOFIBRA) 160 mg tablet 90 Tablet 1     [id number] - [place], [place] - [address]  [phone]

## 2021-05-18 NOTE — Telephone Encounter (Signed)
Pt last seen on 03/05/21. Due to return in one year. Pt pt has appt with Dr Lilian Kapur on 03/08/22. Will forward to on call MD Dr Debroah Loop.

## 2021-05-19 MED ORDER — FENOFIBRATE 160 MG TAB
160 mg | ORAL_TABLET | ORAL | 1 refills | Status: DC
Start: 2021-05-19 — End: 2021-05-20

## 2021-05-19 NOTE — Telephone Encounter (Signed)
Reason for call:  Patient needs to have fenofibrate refilled.  Pharmacy called yesterday for OK to fill but has not heard from the office.  Is this a new problem: yes     Date of last appointment:  03/05/2021     Can we respond via MyChart: no    Best call back number:   Bradlee, Heitman (EC) [phone] [facility])

## 2021-05-20 MED ORDER — FENOFIBRATE 160 MG TAB
160 mg | ORAL_TABLET | ORAL | 1 refills | Status: DC
Start: 2021-05-20 — End: 2021-11-23

## 2021-05-20 NOTE — Addendum Note (Signed)
Addendum Note by Primitivo Gauze, NP at 05/20/21 1006                Author: Primitivo Gauze, NP  Service: --  Author Type: Nurse Practitioner       Filed: 05/20/21 1006  Encounter Date: 05/19/2021  Status: Signed          Editor: Primitivo Gauze, NP (Nurse Practitioner)          Addended by: Geraldine Solar D on: 05/20/2021 10:06 AM    Modules accepted: Orders

## 2021-10-13 ENCOUNTER — Ambulatory Visit: Attending: Family | Primary: Internal Medicine

## 2021-10-13 ENCOUNTER — Ambulatory Visit
Admit: 2021-10-13 | Discharge: 2021-10-13 | Payer: BLUE CROSS/BLUE SHIELD | Attending: Family | Primary: Internal Medicine

## 2021-10-13 DIAGNOSIS — I10 Essential (primary) hypertension: Secondary | ICD-10-CM

## 2021-10-13 MED ORDER — LISINOPRIL 10 MG TAB
10 mg | ORAL_TABLET | Freq: Every day | ORAL | 0 refills | Status: DC
Start: 2021-10-13 — End: 2021-11-03

## 2021-10-13 NOTE — Progress Notes (Signed)
HISTORY OF PRESENT ILLNESS  Jay Lee is a 61 y.o. male. Patient reports recent elevated blood pressure readings, initially noted at eye doctor on Friday(10/09/21). Readings have been around 180s/upper 80s-90s. He notes dull headaches intermittently over the past few months. No blurred vision. He notes mild dizziness with sudden position changes. No weakness of extremities. He does report chest tightness at times. NO shortness of breath, but notes palpitations at times over the past few days.  Visit Vitals  BP (!) 188/85   Pulse 60   Temp 97.5 ??F (36.4 ??C) (Temporal)   Resp 12   Ht 5\' 8"  (1.727 m)   Wt 197 lb (89.4 kg)   SpO2 99%   BMI 29.95 kg/m??     HPI    Review of Systems   Cardiovascular:  Positive for palpitations.   Neurological:  Positive for headaches.     Physical Exam  Constitutional:       Appearance: Normal appearance.   Neck:      Vascular: No carotid bruit.   Cardiovascular:      Rate and Rhythm: Normal rate and regular rhythm.   Pulmonary:      Effort: Pulmonary effort is normal.      Breath sounds: Normal breath sounds.   Musculoskeletal:      Cervical back: Normal range of motion.      Right lower leg: No edema.      Left lower leg: No edema.   Skin:     General: Skin is warm and dry.   Neurological:      General: No focal deficit present.      Mental Status: He is alert and oriented to person, place, and time.   Psychiatric:         Mood and Affect: Mood normal.         Behavior: Behavior normal.       ASSESSMENT and PLAN    ICD-10-CM ICD-9-CM    1. Essential hypertension  I10 401.9       2. Chest tightness  R07.89 786.59 AMB POC EKG ROUTINE W/ 12 LEADS, INTER & REP      Start lisinopril 10 mg  Monitor BP at home 3 times weekly  Follow-up in 2 weeks for BP check

## 2021-10-26 MED ORDER — OMEPRAZOLE 20 MG CAP, DELAYED RELEASE
20 mg | ORAL_CAPSULE | Freq: Every day | ORAL | 1 refills | Status: AC
Start: 2021-10-26 — End: ?

## 2021-10-26 NOTE — Telephone Encounter (Signed)
 Pt last seen on 03/05/21. Due to return in one year.   Pt scheduled with Dr Silva on 03/08/22.  Will forward to on call MD Dr Keven for refill.

## 2021-10-26 NOTE — Telephone Encounter (Signed)
 Requested Prescriptions     Pending Prescriptions Disp Refills    omeprazole  (PRILOSEC) 20 mg capsule 90 Capsule 1     Sig: Take 1 Capsule by mouth daily.     [id number] [place], [place] - [address]  [phone]

## 2021-11-03 ENCOUNTER — Ambulatory Visit: Attending: Family | Primary: Internal Medicine

## 2021-11-03 ENCOUNTER — Ambulatory Visit
Admit: 2021-11-03 | Discharge: 2021-11-03 | Payer: BLUE CROSS/BLUE SHIELD | Attending: Family | Primary: Internal Medicine

## 2021-11-03 DIAGNOSIS — I10 Essential (primary) hypertension: Secondary | ICD-10-CM

## 2021-11-03 MED ORDER — LISINOPRIL 20 MG TAB
20 mg | ORAL_TABLET | Freq: Every day | ORAL | 0 refills | Status: AC
Start: 2021-11-03 — End: ?

## 2021-11-03 NOTE — Progress Notes (Signed)
HISTORY OF PRESENT ILLNESS  Jay Lee is a 61 y.o. male. Patient for hypertension follow-up. Patient was started on lisinopril 10 mg 3 weeks ago. BP was 188/85 at the time. Recent home bp readings are as follows: 140/76, 129/72, 147/81, 146/83, 138/73, 133/82, 152/80, 140/79. Patient denies headaches or dizziness. He has a slight cough in the morning, but this is not new.  Visit Vitals  BP (!) 169/81   Pulse 68   Temp 98.4 ??F (36.9 ??C) (Temporal)   Resp 16   Ht 5\' 8"  (1.727 m)   Wt 195 lb 3.2 oz (88.5 kg)   SpO2 100%   BMI 29.68 kg/m??       HPI    Review of Systems   Neurological:  Negative for dizziness and headaches.     Physical Exam  Constitutional:       Appearance: Normal appearance.   Cardiovascular:      Rate and Rhythm: Normal rate and regular rhythm.   Skin:     General: Skin is warm and dry.   Neurological:      General: No focal deficit present.      Mental Status: He is alert and oriented to person, place, and time.   Psychiatric:         Mood and Affect: Mood normal.         Behavior: Behavior normal.       ASSESSMENT and PLAN    ICD-10-CM ICD-9-CM    1. Essential hypertension  I10 401.9       Increase lisinopril to 20 mg daily  Continue to keep home BP log  Follow-up in 1 month

## 2021-11-23 NOTE — Telephone Encounter (Signed)
 Requested Prescriptions     Pending Prescriptions Disp Refills    fenofibrate (LOFIBRA) 160 mg tablet 90 Tablet 1     Sig: TAKE 1 TABLET EVERY DAY BY MOUTH A     [id number] [place], [place] - [address]  [phone]    [place] - [phone]

## 2021-11-24 MED ORDER — FENOFIBRATE 160 MG TAB
160 mg | ORAL_TABLET | ORAL | 1 refills | Status: AC
Start: 2021-11-24 — End: ?

## 2021-12-08 ENCOUNTER — Ambulatory Visit: Attending: Family | Primary: Internal Medicine

## 2021-12-08 ENCOUNTER — Ambulatory Visit
Admit: 2021-12-08 | Discharge: 2021-12-08 | Payer: BLUE CROSS/BLUE SHIELD | Attending: Family | Primary: Internal Medicine

## 2021-12-08 DIAGNOSIS — I10 Essential (primary) hypertension: Secondary | ICD-10-CM

## 2021-12-08 MED ORDER — AMLODIPINE 5 MG TAB
5 mg | ORAL_TABLET | Freq: Every day | ORAL | 0 refills | Status: AC
Start: 2021-12-08 — End: 2021-12-29

## 2021-12-08 NOTE — Progress Notes (Signed)
HISTORY OF PRESENT ILLNESS  Jay Lee is a 61 y.o. male. Patient presents for hypertension follow-up. Patient increased lisinopril to 20 mg about 1 month ago. He has not been checking readings at home.  He denies dizziness, but has felt very brief episodes of chest tightness when he is kneeling and turns his chest/body quickly. No SOB, headaches, vision changes.   Repeat Manual BP today- 160/92  Visit Vitals  BP (!) 180/92   Pulse 62   Temp 97.9 F (36.6 C) (Temporal)   Resp 16   Ht 5\' 8"  (1.727 m)   Wt 197 lb 6.4 oz (89.5 kg)   SpO2 99%   BMI 30.01 kg/m       HPI    Review of Systems   Respiratory:  Negative for shortness of breath.    Cardiovascular:  Negative for palpitations.   Neurological:  Negative for dizziness and headaches.     Physical Exam  Constitutional:       Appearance: Normal appearance.   Cardiovascular:      Rate and Rhythm: Normal rate and regular rhythm.   Pulmonary:      Effort: Pulmonary effort is normal.      Breath sounds: Normal breath sounds.   Skin:     General: Skin is warm and dry.   Neurological:      General: No focal deficit present.      Mental Status: He is alert and oriented to person, place, and time.   Psychiatric:         Mood and Affect: Mood normal.         Behavior: Behavior normal.       ASSESSMENT and PLAN    ICD-10-CM ICD-9-CM    1. Essential hypertension  I10 401.9       Will switch to amlodipine 5 mg  May increased in 2 weeks to 10 mg if BP not below 140s systolic  Follow-up in 1 month

## 2021-12-29 MED ORDER — AMLODIPINE 5 MG TAB
5 mg | ORAL_TABLET | ORAL | 0 refills | Status: AC
Start: 2021-12-29 — End: ?

## 2022-01-08 ENCOUNTER — Encounter: Attending: Family | Primary: Internal Medicine

## 2022-01-13 ENCOUNTER — Ambulatory Visit
Admit: 2022-01-13 | Discharge: 2022-01-14 | Payer: BLUE CROSS/BLUE SHIELD | Attending: Family | Primary: Internal Medicine

## 2022-01-13 ENCOUNTER — Ambulatory Visit: Payer: BLUE CROSS/BLUE SHIELD | Attending: Family | Primary: Internal Medicine

## 2022-01-13 DIAGNOSIS — I10 Essential (primary) hypertension: Secondary | ICD-10-CM

## 2022-01-13 MED ORDER — AMLODIPINE BESYLATE 10 MG PO TABS
10 MG | ORAL_TABLET | Freq: Every day | ORAL | 1 refills | Status: DC
Start: 2022-01-13 — End: 2022-02-01

## 2022-01-13 NOTE — Progress Notes (Addendum)
Jay Lee (DOB:  July 14, 1961) is a 61 y.o. male,here for evaluation of the following chief complaint(s):  Hypertension    BP (!) 146/89   Pulse (!) 110   Resp 20   Ht 5\' 8"  (1.727 m)   Wt 196 lb 3.2 oz (89 kg)   SpO2 98%   BMI 29.83 kg/m       SUBJECTIVE/OBJECTIVE:    presents for hypertension follow-up. He switched to amlodipine 5 mg 1 month ago. Home BP readings have been 142/80, 132/79, 134/77, 128/74, 147/84, 143/86, 141/80, 135/73, 143/78. He is feeling better overall. No episodes of chest tightness. No SOB. 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 He sees [facility]BW:[id number] at [facility].    Review of Systems   Respiratory:  Negative for shortness of breath.    Cardiovascular:  Negative for chest pain and palpitations.     Physical Exam  Constitutional:       Appearance: Normal appearance.   HENT:      Head: Normocephalic.   Cardiovascular:      Rate and Rhythm: Normal rate and regular rhythm.   Pulmonary:      Effort: Pulmonary effort is normal.      Breath sounds: Normal breath sounds.   Skin:     General: Skin is warm and dry.   Neurological:      General: No focal deficit present.      Mental Status: He is alert and oriented to person, place, and time.   Psychiatric:         Mood and Affect: Mood normal.         Behavior: Behavior normal.        ASSESSMENT/PLAN:  1. Essential (primary) hypertension    Increase amlodipine to 10 mg daily  Follow-up in 2 months with McDonald as scheduled  Check BP 3 times weekly and keep log    --[facility], APRN - NP

## 2022-02-01 MED ORDER — AMLODIPINE BESYLATE 10 MG PO TABS
10 MG | ORAL_TABLET | Freq: Every day | ORAL | 0 refills | Status: AC
Start: 2022-02-01 — End: 2022-04-14

## 2022-02-01 NOTE — Telephone Encounter (Signed)
Pt last seen on 01/13/22. Due to return in 3 months.pt has seen Roselyn Reef, NP once a month since February.    Has appt on to establish with you on 04/08/22.    Will forward to MD for refill.

## 2022-03-08 ENCOUNTER — Ambulatory Visit
Admit: 2022-03-08 | Discharge: 2022-03-08 | Payer: BLUE CROSS/BLUE SHIELD | Attending: Student in an Organized Health Care Education/Training Program | Primary: Student in an Organized Health Care Education/Training Program

## 2022-03-08 ENCOUNTER — Encounter: Attending: Student in an Organized Health Care Education/Training Program | Primary: Internal Medicine

## 2022-03-08 DIAGNOSIS — Z0000 Encounter for general adult medical examination without abnormal findings: Secondary | ICD-10-CM

## 2022-03-08 MED ORDER — ROSUVASTATIN CALCIUM 5 MG PO TABS
5 MG | ORAL_TABLET | Freq: Every day | ORAL | 1 refills | Status: DC
Start: 2022-03-08 — End: 2022-07-12

## 2022-03-08 NOTE — Assessment & Plan Note (Signed)
BP slightly elevated in office. Encouraged home monitoring with goal BP <140/90. cont amlodipine.

## 2022-03-08 NOTE — Assessment & Plan Note (Signed)
R submandibular swelling compared to L. Noted by his dentist. He hasn't noted any pain or swelling. Will order ultrasound to eval further.

## 2022-03-08 NOTE — Progress Notes (Signed)
Jay Lee is a 61 y.o. year old male who is a new patient to me today (03/08/22).  He was previous followed by Dr Dondra Spry.      Assessment & Plan:   1. Routine general medical examination at a health care facility  Reviewed diet and exercise habits - he does well with this. Updated health maintenance, see below. Check screening labs.   -     Comprehensive Metabolic Panel; Future  -     CBC with Auto Differential; Future  2. Essential (primary) hypertension  Assessment & Plan:  BP slightly elevated in office. Encouraged home monitoring with goal BP <140/90. cont amlodipine.     3. Mixed hyperlipidemia  Assessment & Plan:  He is on fenofibrate. He doesn't have hx hyperTG. His ASCVD risk is elevated. Will have him switch to statin.    Orders:  -     Lipid Panel; Future  -     rosuvastatin (CRESTOR) 5 MG tablet; Take 1 tablet by mouth daily, Disp-90 tablet, R-1Normal  4. Subclinical hypothyroidism  Assessment & Plan:  Hx mild TSH elevation. TPO negative in the past. Will update to assess for progression    Orders:  -     T4, Free; Future  -     TSH; Future  5. Submandibular swelling  Assessment & Plan:  R submandibular swelling compared to L. Noted by his dentist. He hasn't noted any pain or swelling. Will order ultrasound to eval further.    Orders:  -     [place] HEAD NECK SOFT TISSUE THYROID; Future  6. Gastroesophageal reflux disease, unspecified whether esophagitis present  Assessment & Plan:  Controlled, cont PPI   7. Encounter for screening for HIV  Consents to screening today   -     HIV 1/2 Ag/Ab, 4TH Generation,W Rflx Confirm; Future  8. Encounter for hepatitis C screening test for low risk patient  -     Hepatitis C Antibody; Future  9. Prostate cancer screening  -     PSA Screening; Future      Health Maintenance   Flu vaccine:  COVID vaccine: recommend COVID booster in the fall   Tetanus vaccine: 02/2021  Shingles vaccine: declines vaccine   Pneumonia vaccine:   Colon cancer screening: 01/2020 q5 yr  PSA:  check today   Hep C: will check today   HIV: will check today   Lipid: will check today   DM:   Healthcare decision maker: wife  ACP:      RTC: 1 year annual     Subjective:   Jay Lee was seen today for Annual Exam    HTN  - amlodipine 10mg    - checks home BP but not sure of the readings    Taking fenofibrate, not sure why. Denies hx high TG.    The 10-year ASCVD risk score (Arnett DK, et al., 2019) is: 14.5%    Values used to calculate the score:      Age: 27 years      Sex: Male      Is Non-Hispanic African American: No      Diabetic: No      Tobacco smoker: No      Systolic Blood Pressure: 146 mmHg      Is BP treated: Yes      HDL Cholesterol: 59 mg/dL      Total Cholesterol: 225 mg/dL      GERD  - omeprazole works well, no dysphagia.  Went to the dentist and he was told his neck "feels bigger than he would like it to be". He has not noticed any lumps or swelling in the neck.    Taking asa 325 mg since he had knee surgery     Diet - avoids sweets. Eats keto processed foods    Exercise - bike, weights, sit-ups. Exercises 3x/week 1 hour    Review of Systems   All other systems reviewed and are negative.      PMHx    Patient Active Problem List   Diagnosis    Essential (primary) hypertension    Mixed hyperlipidemia    Subclinical hypothyroidism    Submandibular swelling    GERD (gastroesophageal reflux disease)       Past Medical History:   Diagnosis Date    GERD (gastroesophageal reflux disease)     High cholesterol     Hypertension        Prior to Admission medications    Medication Sig Start Date End Date Taking? Authorizing Provider   rosuvastatin (CRESTOR) 5 MG tablet Take 1 tablet by mouth daily 03/08/22  Yes York Pellant, MD   amLODIPine (NORVASC) 10 MG tablet Take 1 tablet by mouth daily 02/01/22   York Pellant, MD   Multiple Vitamins-Minerals (CENTRUM SILVER 50+MEN PO) Centrum Silver    Historical Provider, MD   L-LYSINE PO Take by mouth    Ar Automatic Reconciliation   aspirin 325 MG tablet Take  1 tablet by mouth daily    Ar Automatic Reconciliation   vitamin D (CHOLECALCIFEROL) 25 MCG (1000 UT) TABS tablet Take 1 tablet by mouth daily    Ar Automatic Reconciliation   cyanocobalamin 2000 MCG tablet Take by mouth daily    Ar Automatic Reconciliation   Flaxseed, Linseed, (FLAX SEED OIL) 1000 MG CAPS Take by mouth daily    Ar Automatic Reconciliation   omeprazole (PRILOSEC) 20 MG delayed release capsule Take 1 capsule by mouth daily 10/26/21   Ar Automatic Reconciliation       PSHx    Past Surgical History:   Procedure Laterality Date    ORTHOPEDIC SURGERY Right     FOREARM    ORTHOPEDIC SURGERY Right     ELBOW    ORTHOPEDIC SURGERY Left     shoulder replacement    ORTHOPEDIC SURGERY Right     rotator cuff    TOTAL KNEE ARTHROPLASTY Right        FH    Family History   Problem Relation Age of Onset    Elevated Lipids Mother     Osteoarthritis Mother     Hypertension Mother     Diabetes Mother     Lung Disease Father     Heart Disease Brother     Osteoarthritis Brother     Colon Cancer Neg Hx     Prostate Cancer Neg Hx         SH    Social History     Occupational History    Not on file   Tobacco Use    Smoking status: Never    Smokeless tobacco: Never   Substance and Sexual Activity    Alcohol use: Yes     Comment: 2 days/week, 6 beers/day    Drug use: Never    Sexual activity: Not on file        The following sections were reviewed & updated as appropriate: Problem List, Allergies, PMH, PSH, FH, and SH.  Objective:   There were no vitals taken for this visit.    Physical Exam  Constitutional:       General: He is not in acute distress.  Eyes:      Extraocular Movements: Extraocular movements intact.   Neck:      Comments: R submandibular swelling. No pain or tenderness. No thyromegaly or palpable thyroid nodule  Cardiovascular:      Rate and Rhythm: Normal rate and regular rhythm.      Heart sounds: No murmur heard.  Pulmonary:      Effort: Pulmonary effort is normal. No respiratory distress.   Abdominal:       General: Bowel sounds are normal.      Palpations: Abdomen is soft.   Musculoskeletal:      Cervical back: Normal range of motion.      Right lower leg: No edema.      Left lower leg: No edema.   Lymphadenopathy:      Cervical: No cervical adenopathy.   Neurological:      General: No focal deficit present.      Mental Status: He is alert.   Psychiatric:         Mood and Affect: Mood normal.         Behavior: Behavior normal.            York Pellant, MD

## 2022-03-08 NOTE — Assessment & Plan Note (Signed)
He is on fenofibrate. He doesn't have hx hyperTG. His ASCVD risk is elevated. Will have him switch to statin.

## 2022-03-08 NOTE — Assessment & Plan Note (Signed)
Controlled, cont PPI

## 2022-03-08 NOTE — Assessment & Plan Note (Signed)
Hx mild TSH elevation. TPO negative in the past. Will update to assess for progression

## 2022-03-22 ENCOUNTER — Ambulatory Visit: Payer: BLUE CROSS/BLUE SHIELD | Primary: Student in an Organized Health Care Education/Training Program

## 2022-03-23 ENCOUNTER — Inpatient Hospital Stay
Admit: 2022-03-23 | Payer: BLUE CROSS/BLUE SHIELD | Attending: Student in an Organized Health Care Education/Training Program | Primary: Student in an Organized Health Care Education/Training Program

## 2022-03-23 DIAGNOSIS — R220 Localized swelling, mass and lump, head: Secondary | ICD-10-CM

## 2022-04-09 ENCOUNTER — Encounter

## 2022-04-10 LAB — COMPREHENSIVE METABOLIC PANEL
ALT: 21 IU/L (ref 0–44)
AST: 23 IU/L (ref 0–40)
Albumin/Globulin Ratio: 1.9 (ref 1.2–2.2)
Albumin: 4.5 g/dL (ref 3.9–4.9)
Alkaline Phosphatase: 57 IU/L (ref 44–121)
BUN/Creatinine Ratio: 20 (ref 10–24)
BUN: 16 mg/dL (ref 8–27)
CO2: 22 mmol/L (ref 20–29)
Calcium: 9.3 mg/dL (ref 8.6–10.2)
Chloride: 107 mmol/L — ABNORMAL HIGH (ref 96–106)
Creatinine: 0.8 mg/dL (ref 0.76–1.27)
Est, Glomerular Filtration Rate: 100 mL/min/{1.73_m2} (ref >59)
Globulin, Total: 2.4 g/dL (ref 1.5–4.5)
Glucose: 90 mg/dL (ref 70–99)
Potassium: 4.2 mmol/L (ref 3.5–5.2)
Sodium: 142 mmol/L (ref 134–144)
Total Bilirubin: 0.6 mg/dL (ref 0.0–1.2)
Total Protein: 6.9 g/dL (ref 6.0–8.5)

## 2022-04-10 LAB — CBC WITH AUTO DIFFERENTIAL
Basophils %: 1 %
Basophils Absolute: 0.1 10*3/uL (ref 0.0–0.2)
Eosinophils %: 4 %
Eosinophils Absolute: 0.2 10*3/uL (ref 0.0–0.4)
Hematocrit: 44.6 % (ref 37.5–51.0)
Hemoglobin: 15.1 g/dL (ref 13.0–17.7)
Immature Grans (Abs): 0 10*3/uL (ref 0.0–0.1)
Immature Granulocytes: 0 %
Lymphocytes %: 33 %
Lymphocytes Absolute: 1.8 10*3/uL (ref 0.7–3.1)
MCH: 30.6 pg (ref 26.6–33.0)
MCHC: 33.9 g/dL (ref 31.5–35.7)
MCV: 90 fL (ref 79–97)
Monocytes %: 7 %
Monocytes Absolute: 0.4 10*3/uL (ref 0.1–0.9)
Neutrophils %: 55 %
Neutrophils Absolute: 3.1 10*3/uL (ref 1.4–7.0)
Platelets: 227 10*3/uL (ref 150–450)
RBC: 4.94 x10E6/uL (ref 4.14–5.80)
RDW: 13.1 % (ref 11.6–15.4)
WBC: 5.5 10*3/uL (ref 3.4–10.8)

## 2022-04-10 LAB — T4, FREE: T4 Free: 0.95 ng/dL (ref 0.82–1.77)

## 2022-04-10 LAB — HIV 1/2 AG/AB, 4TH GENERATION,W RFLX CONFIRM: HIV Screen 4th Generation wRfx: NONREACTIVE

## 2022-04-10 LAB — HEPATITIS C ANTIBODY: Hepatitis C Ab: NONREACTIVE

## 2022-04-10 LAB — LIPID PANEL
Cholesterol: 213 mg/dL — ABNORMAL HIGH (ref 100–199)
HDL: 56 mg/dL (ref >39)
LDL Calculated: 140 mg/dL — ABNORMAL HIGH (ref 0–99)
Triglycerides: 95 mg/dL (ref 0–149)
VLDL Cholesterol Calculated: 17 mg/dL (ref 5–40)

## 2022-04-10 LAB — TSH: TSH: 4.54 u[IU]/mL — ABNORMAL HIGH (ref 0.450–4.500)

## 2022-04-10 LAB — PSA SCREENING: PSA: 2.6 ng/mL (ref 0.0–4.0)

## 2022-04-10 NOTE — Other (Signed)
Will send [facility]  - PSA up from 1.1 to 2.6 - repeat in 6 months  - no response to crestor 5mg 

## 2022-04-12 NOTE — Telephone Encounter (Signed)
Pt last seen on 03/08/22. Due to return in one year.    Has appt on 03/11/23.    Rx last filled by Jamie,NP on 11/03/21 #90/0RF.    Will forward to MD for refill.

## 2022-04-14 MED ORDER — AMLODIPINE BESYLATE 10 MG PO TABS
10 MG | ORAL_TABLET | ORAL | 1 refills | Status: DC
Start: 2022-04-14 — End: 2022-11-01

## 2022-04-14 NOTE — Telephone Encounter (Signed)
VORB Dr. Mcdonald

## 2022-04-21 MED ORDER — OMEPRAZOLE 20 MG PO CPDR
20 MG | ORAL_CAPSULE | Freq: Every day | ORAL | 3 refills | Status: DC
Start: 2022-04-21 — End: 2023-04-08

## 2022-04-21 NOTE — Telephone Encounter (Signed)
Pt last seen on 03/08/22. Due to return in one year.    Has appt on 03/11/23.    Will forward to MD for refill.

## 2022-04-21 NOTE — Telephone Encounter (Signed)
Patient needs the following med refilled --has been out for a week.  Patient's wife was told by the pharmacy that they sent a request last week.  Do not see anything in his chart about a request.      omeprazole (PRILOSEC) 20 mg capsule      [phone]

## 2022-06-11 ENCOUNTER — Ambulatory Visit
Admit: 2022-06-11 | Discharge: 2022-06-11 | Payer: BLUE CROSS/BLUE SHIELD | Attending: Internal Medicine | Primary: Student in an Organized Health Care Education/Training Program

## 2022-06-11 DIAGNOSIS — K429 Umbilical hernia without obstruction or gangrene: Secondary | ICD-10-CM

## 2022-06-11 NOTE — Progress Notes (Signed)
Jay Lee is a 61 y.o. male who was seen in clinic today (06/11/2022) for an acute visit.      Assessment & Plan:   Below is the assessment and plan developed based on review of pertinent history, physical exam, labs, studies, and medications.    1. Umbilical hernia without obstruction and without gangrene  Comments:  new dx, differential reviewed, looks classic. WIll have him talk to surgeon. Red flags & expectations reviewed.  Orders:  -     [facility] Debbe Bales, MD, Bariatric Surgery, [place] ([address])     Return if symptoms worsen or fail to improve.   Subjective/Objective:   Jay Lee was seen today for Abdominal Pain    Dermatology Review  He is here to talk about a spot in his belly button.  He noticed it about a month ago with gradually worsening since that time.  Initially he could only feel it inside his belly button.  Now is palpable outside.  It is painful with pushing on it.  Nothing makes it better or worse.  Treatment to date has included  none.   This is a new issues.  No h/o trauma.       Prior to Admission medications    Medication Sig Start Date End Date Taking? Authorizing Provider   omeprazole (PRILOSEC) 20 MG delayed release capsule Take 1 capsule by mouth daily 04/21/22  Yes McDonald, Lindajo Royal, MD   amLODIPine (NORVASC) 10 MG tablet TAKE 1 TABLET BY MOUTH EVERY DAY 04/14/22  Yes Frances Maywood, MD   rosuvastatin (CRESTOR) 5 MG tablet Take 1 tablet by mouth daily 03/08/22  Yes McDonald, Lindajo Royal, MD   Multiple Vitamins-Minerals (CENTRUM SILVER 50+MEN PO) Centrum Silver   Yes [provider]   vitamin D (CHOLECALCIFEROL) 25 MCG (1000 UT) TABS tablet Take 1 tablet by mouth daily   Yes Automatic Reconciliation, Ar   cyanocobalamin 2000 MCG tablet Take by mouth daily   Yes Automatic Reconciliation, Ar   Flaxseed, Linseed, (FLAX SEED OIL) 1000 MG CAPS Take by mouth daily   Yes Automatic Reconciliation, Ar        Review of Systems   Gastrointestinal:  Negative for abdominal  distention, abdominal pain, constipation, diarrhea, nausea and vomiting.        Physical Exam  Abdominal:      Hernia: A hernia is present. Hernia is present in the umbilical area (1cm, left side, easily reducible, worse w/ valsalva).        Vitals:    06/11/22 1025 06/11/22 1028   BP: (!) 172/91 (!) 148/79   Pulse: 56 58   Resp: 16    Temp: 98.1 F (36.7 C)    TempSrc: Temporal    SpO2: 99%    Weight: 191 lb (86.6 kg)    Height: 5\' 8"  (1.727 m)         Advised him to call back or return to office if symptoms worsen/change/persist.  Discussed expected course/resolution/complications of diagnosis in detail with patient.  Medication risks/benefits/costs/interactions/alternatives discussed with patient.    Renaee Munda, MD

## 2022-06-11 NOTE — Telephone Encounter (Signed)
Attempted to contact patient regarding referral from Dr. Sherryle Lis to Dr. Truman Hayward for umbilical hernia without obstruction and without gangrene. No answer, left voicemail for a return call.

## 2022-06-17 ENCOUNTER — Ambulatory Visit
Admit: 2022-06-17 | Discharge: 2022-06-17 | Payer: BLUE CROSS/BLUE SHIELD | Attending: Surgery | Primary: Student in an Organized Health Care Education/Training Program

## 2022-06-17 DIAGNOSIS — K429 Umbilical hernia without obstruction or gangrene: Secondary | ICD-10-CM

## 2022-06-17 NOTE — Progress Notes (Signed)
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 male who who has an umbilical hernia.  The umbilical hernia appeared to have popped up more recently in the past few weeks to month or 2.  He has been having some pain at the umbilicus with an associated bulge.  He is able to push it back in but it is becoming a little bit more painful and more difficult to push in.  The pain when he has it is a 3-4 out of 10.  It hurts with certain straining motions and touching it.  He has been having normal bowel movements no nausea or vomiting.    Past Medical History:   Diagnosis Date    GERD (gastroesophageal reflux disease)     High cholesterol     Hypertension        Past Surgical History:   Procedure Laterality Date    ORTHOPEDIC SURGERY Right     FOREARM    ORTHOPEDIC SURGERY Right     ELBOW    ORTHOPEDIC SURGERY Left     shoulder replacement    ORTHOPEDIC SURGERY Right     rotator cuff    TOTAL KNEE ARTHROPLASTY Right          Current Outpatient Medications:     omeprazole (PRILOSEC) 20 MG delayed release capsule, Take 1 capsule by mouth daily, Disp: 90 capsule, Rfl: 3    amLODIPine (NORVASC) 10 MG tablet, TAKE 1 TABLET BY MOUTH EVERY DAY, Disp: 90 tablet, Rfl: 1    rosuvastatin (CRESTOR) 5 MG tablet, Take 1 tablet by mouth daily, Disp: 90 tablet, Rfl: 1    Multiple Vitamins-Minerals (CENTRUM SILVER 50+MEN PO), Centrum Silver, Disp: , Rfl:     vitamin D (CHOLECALCIFEROL) 25 MCG (1000 UT) TABS tablet, Take 1 tablet by mouth daily, Disp: , Rfl:     cyanocobalamin 2000 MCG tablet, Take by mouth daily, Disp: , Rfl:     Flaxseed, Linseed, (FLAX SEED OIL) 1000 MG CAPS, Take by mouth daily, Disp: , Rfl:     No Known Allergies    Social History     Socioeconomic History    Marital status: Married     Spouse name: Not on file    Number of children: Not on file    Years of education: Not on file    Highest education level: Not on file    Occupational History    Not on file   Tobacco Use    Smoking status: Never    Smokeless tobacco: Never   Substance and Sexual Activity    Alcohol use: Yes     Comment: 2 days/week, 6 beers/day    Drug use: Never    Sexual activity: Not on file   Other Topics Concern    Not on file   Social History Narrative    Not on file     Social Determinants of Health     Financial Resource Strain: Low Risk  (03/08/2022)    Overall Financial Resource Strain (CARDIA)     Difficulty of Paying Living Expenses: Not hard at all   Food Insecurity: No Food Insecurity (03/08/2022)    Hunger Vital Sign     Worried About Running Out of Food in the Last Year: Never true     Ran Out of Food in the Last Year: Never true   Transportation  Needs: Unknown (03/08/2022)    PRAPARE - Armed forces logistics/support/administrative officer (Medical): Not on file     Lack of Transportation (Non-Medical): No   Physical Activity: Not on file   Stress: Not on file   Social Connections: Not on file   Intimate Partner Violence: Not on file   Housing Stability: Unknown (03/08/2022)    Housing Stability Vital Sign     Unable to Pay for Housing in the Last Year: Not on file     Number of Places Lived in the Last Year: Not on file     Unstable Housing in the Last Year: No       Family History   Problem Relation Age of Onset    Elevated Lipids Mother     Osteoarthritis Mother     Hypertension Mother     Diabetes Mother     Lung Disease Father     Heart Disease Brother     Osteoarthritis Brother     Colon Cancer Neg Hx     Prostate Cancer Neg Hx        ROS   Constitutional: negative  Ears, Nose, Mouth, Throat, and Face: negative  Respiratory: negative  Cardiovascular: negative  Gastrointestinal: positive for abdominal pain  Genitourinary:negative  Integument/Breast: negative  Hematologic/Lymphatic: negative  Behavioral/Psychiatric: negative  Allergic/Immunologic: negative      Physical Exam:     Vitals:    06/17/22 0925   BP: (!) 181/82   Pulse:    Resp:    Temp:    SpO2:       Last Weight Metrics:      06/17/2022     9:20 AM 06/11/2022    10:25 AM 03/08/2022     1:34 PM 01/13/2022     7:55 AM 12/08/2021     7:44 AM 11/03/2021     7:48 AM 10/13/2021     8:01 AM   Weight Loss Metrics   Height 5\' 8"  5\' 8"  5\' 8"  5\' 8"  5\' 8"  5\' 8"  5\' 8"    Weight - Scale 191 lbs 13 oz 191 lbs 191 lbs 13 oz 196 lbs 3 oz 197 lbs 6 oz 195 lbs 3 oz 197 lbs   BMI (Calculated) 29.2 kg/m2 29.1 kg/m2 29.2 kg/m2 29.9 kg/m2 30.1 kg/m2 29.7 kg/m2 30 kg/m2        General - alert and oriented, no apparent distress  HEENT - no jaundice, no hearing imparement  Pulm - CTAB, no C/W/R  CV - RRR, no M/R/G  Abd -soft, nondistended, bowel sounds present, small umbilical hernia which is soft and partially reducible, defect likely less than a centimeter, mild tenderness to palpation  Ext - pulses intact in UE and LE bilaterally, no edema  Skin - supple, no rashes  Psychiatric - normal affect, good mood    Labs  Lab Results   Component Value Date    NA 142 04/09/2022    K 4.2 04/09/2022    CL 107 (H) 04/09/2022    CO2 22 04/09/2022    BUN 16 04/09/2022    CREATININE 0.80 04/09/2022    GLUCOSE 90 04/09/2022    CALCIUM 9.3 04/09/2022    PROT 6.9 04/09/2022    LABALBU 4.5 04/09/2022    BILITOT 0.6 04/09/2022    ALKPHOS 57 04/09/2022    AST 23 04/09/2022    ALT 21 04/09/2022    LABGLOM 100 04/09/2022    GFRAA 109 01/17/2020    AGRATIO  1.9 04/09/2022       Lab Results   Component Value Date    WBC 5.5 04/09/2022    HGB 15.1 04/09/2022    HCT 44.6 04/09/2022    MCV 90 04/09/2022    PLT 227 04/09/2022         Imaging  None  I have reviewed and agree with all of the pertinent images    Assessment:     Jay Lee is a 61 y.o. male with umbilical hernia    Recommendations:     1.   He appears to have a small soft and partially reducible umbilical hernia.  Defect appears to be likely a centimeter or less.  I will schedule him for open umbilical hernia repair and mesh will likely not be needed due to the small size of the defect.  I have  discussed the above procedure with the patient in detail.  We reviewed the benefits and possible complications of the surgery which include bleeding, infection, damage to adjacent organs, venous thromboembolism, need for repeat surgery, death and other unforseen complications.  The patient agreed to proceed with the surgery.        Marton Redwood, MD    Greater than half of the time: 30 minutes was used in counciling the patient about diagnosis and treatment plan    Jay Lee has a reminder for a "due or due soon" health maintenance. I have asked that he contact his primary care provider for follow-up on this health maintenance.

## 2022-06-17 NOTE — H&P (View-Only) (Signed)
[facility] at [facility] History and Physical    History of Present Illness:      Jay Lee is a 61 y.o. male who who has an umbilical hernia.  The umbilical hernia appeared to have popped up more recently in the past few weeks to month or 2.  He has been having some pain at the umbilicus with an associated bulge.  He is able to push it back in but it is becoming a little bit more painful and more difficult to push in.  The pain when he has it is a 3-4 out of 10.  It hurts with certain straining motions and touching it.  He has been having normal bowel movements no nausea or vomiting.    Past Medical History:   Diagnosis Date    GERD (gastroesophageal reflux disease)     High cholesterol     Hypertension        Past Surgical History:   Procedure Laterality Date    ORTHOPEDIC SURGERY Right     FOREARM    ORTHOPEDIC SURGERY Right     ELBOW    ORTHOPEDIC SURGERY Left     shoulder replacement    ORTHOPEDIC SURGERY Right     rotator cuff    TOTAL KNEE ARTHROPLASTY Right          Current Outpatient Medications:     omeprazole (PRILOSEC) 20 MG delayed release capsule, Take 1 capsule by mouth daily, Disp: 90 capsule, Rfl: 3    amLODIPine (NORVASC) 10 MG tablet, TAKE 1 TABLET BY MOUTH EVERY DAY, Disp: 90 tablet, Rfl: 1    rosuvastatin (CRESTOR) 5 MG tablet, Take 1 tablet by mouth daily, Disp: 90 tablet, Rfl: 1    Multiple Vitamins-Minerals (CENTRUM SILVER 50+MEN PO), Centrum Silver, Disp: , Rfl:     vitamin D (CHOLECALCIFEROL) 25 MCG (1000 UT) TABS tablet, Take 1 tablet by mouth daily, Disp: , Rfl:     cyanocobalamin 2000 MCG tablet, Take by mouth daily, Disp: , Rfl:     Flaxseed, Linseed, (FLAX SEED OIL) 1000 MG CAPS, Take by mouth daily, Disp: , Rfl:     No Known Allergies    Social History     Socioeconomic History    Marital status: Married     Spouse name: Not on file    Number of children: Not on file    Years of education: Not on file    Highest education level: Not on file    Occupational History    Not on file   Tobacco Use    Smoking status: Never    Smokeless tobacco: Never   Substance and Sexual Activity    Alcohol use: Yes     Comment: 2 days/week, 6 beers/day    Drug use: Never    Sexual activity: Not on file   Other Topics Concern    Not on file   Social History Narrative    Not on file     Social Determinants of Health     Financial Resource Strain: Low Risk  (03/08/2022)    Overall Financial Resource Strain (CARDIA)     Difficulty of Paying Living Expenses: Not hard at all   Food Insecurity: No Food Insecurity (03/08/2022)    Hunger Vital Sign     Worried About Running Out of Food in the Last Year: Never true     Ran Out of Food in the Last Year: Never true   Transportation   Needs: Unknown (03/08/2022)    PRAPARE - Armed forces logistics/support/administrative officer (Medical): Not on file     Lack of Transportation (Non-Medical): No   Physical Activity: Not on file   Stress: Not on file   Social Connections: Not on file   Intimate Partner Violence: Not on file   Housing Stability: Unknown (03/08/2022)    Housing Stability Vital Sign     Unable to Pay for Housing in the Last Year: Not on file     Number of Places Lived in the Last Year: Not on file     Unstable Housing in the Last Year: No       Family History   Problem Relation Age of Onset    Elevated Lipids Mother     Osteoarthritis Mother     Hypertension Mother     Diabetes Mother     Lung Disease Father     Heart Disease Brother     Osteoarthritis Brother     Colon Cancer Neg Hx     Prostate Cancer Neg Hx        ROS   Constitutional: negative  Ears, Nose, Mouth, Throat, and Face: negative  Respiratory: negative  Cardiovascular: negative  Gastrointestinal: positive for abdominal pain  Genitourinary:negative  Integument/Breast: negative  Hematologic/Lymphatic: negative  Behavioral/Psychiatric: negative  Allergic/Immunologic: negative      Physical Exam:     Vitals:    06/17/22 0925   BP: (!) 181/82   Pulse:    Resp:    Temp:    SpO2:       Last Weight Metrics:      06/17/2022     9:20 AM 06/11/2022    10:25 AM 03/08/2022     1:34 PM 01/13/2022     7:55 AM 12/08/2021     7:44 AM 11/03/2021     7:48 AM 10/13/2021     8:01 AM   Weight Loss Metrics   Height 5\' 8"  5\' 8"  5\' 8"  5\' 8"  5\' 8"  5\' 8"  5\' 8"    Weight - Scale 191 lbs 13 oz 191 lbs 191 lbs 13 oz 196 lbs 3 oz 197 lbs 6 oz 195 lbs 3 oz 197 lbs   BMI (Calculated) 29.2 kg/m2 29.1 kg/m2 29.2 kg/m2 29.9 kg/m2 30.1 kg/m2 29.7 kg/m2 30 kg/m2        General - alert and oriented, no apparent distress  HEENT - no jaundice, no hearing imparement  Pulm - CTAB, no C/W/R  CV - RRR, no M/R/G  Abd -soft, nondistended, bowel sounds present, small umbilical hernia which is soft and partially reducible, defect likely less than a centimeter, mild tenderness to palpation  Ext - pulses intact in UE and LE bilaterally, no edema  Skin - supple, no rashes  Psychiatric - normal affect, good mood    Labs  Lab Results   Component Value Date    NA 142 04/09/2022    K 4.2 04/09/2022    CL 107 (H) 04/09/2022    CO2 22 04/09/2022    BUN 16 04/09/2022    CREATININE 0.80 04/09/2022    GLUCOSE 90 04/09/2022    CALCIUM 9.3 04/09/2022    PROT 6.9 04/09/2022    LABALBU 4.5 04/09/2022    BILITOT 0.6 04/09/2022    ALKPHOS 57 04/09/2022    AST 23 04/09/2022    ALT 21 04/09/2022    LABGLOM 100 04/09/2022    GFRAA 109 01/17/2020    AGRATIO  1.9 04/09/2022       Lab Results   Component Value Date    WBC 5.5 04/09/2022    HGB 15.1 04/09/2022    HCT 44.6 04/09/2022    MCV 90 04/09/2022    PLT 227 04/09/2022         Imaging  None  I have reviewed and agree with all of the pertinent images    Assessment:     Jay Lee is a 61 y.o. male with umbilical hernia    Recommendations:     1.   He appears to have a small soft and partially reducible umbilical hernia.  Defect appears to be likely a centimeter or less.  I will schedule him for open umbilical hernia repair and mesh will likely not be needed due to the small size of the defect.  I have  discussed the above procedure with the patient in detail.  We reviewed the benefits and possible complications of the surgery which include bleeding, infection, damage to adjacent organs, venous thromboembolism, need for repeat surgery, death and other unforseen complications.  The patient agreed to proceed with the surgery.        Raja Liska, MD    Greater than half of the time: 30 minutes was used in counciling the patient about diagnosis and treatment plan    Jay Lee has a reminder for a "due or due soon" health maintenance. I have asked that he contact his primary care provider for follow-up on this health maintenance.

## 2022-06-17 NOTE — Progress Notes (Signed)
Identified patient with two patient identifiers (name and DOB). Reviewed chart in preparation for visit and have obtained necessary documentation.    Jay Lee is a 61 y.o. male  Chief Complaint   Patient presents with    New Patient     Umbilical hernia      BP (!) 181/82 (Site: Left Upper Arm, Position: Sitting, Cuff Size: Medium Adult)   Pulse 65   Temp 98.2 F (36.8 C) (Oral)   Resp 20   Ht 1.727 m (5\' 8" )   Wt 87 kg (191 lb 12.8 oz)   SpO2 97%   BMI 29.16 kg/m     1. Have you been to the ER, urgent care clinic since your last visit?  Hospitalized since your last visit?no    2. Have you seen or consulted any other health care providers outside of the [place] since your last visit?  Include any pap smears or colon screening. No    Patient and provider made aware of elevated BP x2. Patient asymptomatic. Patient reminded to monitor BP, continue to take BP medications if prescribed, and follow up with PCP/Cardiologist.  Patient expressed understanding and agreement.

## 2022-06-17 NOTE — Telephone Encounter (Signed)
Left message to schedule surgery with Dr. Lee.

## 2022-06-17 NOTE — Telephone Encounter (Signed)
Patient returned call to schedule surgery.

## 2022-06-23 MED ORDER — SUGAMMADEX SODIUM 200 MG/2ML IV SOLN
200 MG/2ML | INTRAVENOUS | Status: AC
Start: 2022-06-23 — End: ?

## 2022-06-23 MED ORDER — LIDOCAINE HCL (PF) 2 % IJ SOLN
2 % | INTRAMUSCULAR | Status: AC
Start: 2022-06-23 — End: ?

## 2022-06-23 MED FILL — BRIDION 200 MG/2ML IV SOLN: 200 MG/2ML | INTRAVENOUS | Qty: 4

## 2022-06-23 MED FILL — LIDOCAINE HCL (PF) 2 % IJ SOLN: 2 % | INTRAMUSCULAR | Qty: 10

## 2022-07-07 ENCOUNTER — Ambulatory Visit
Payer: BLUE CROSS/BLUE SHIELD | Attending: Surgery | Primary: Student in an Organized Health Care Education/Training Program

## 2022-07-07 NOTE — Other (Addendum)
PAT PHONECALL COMPLETED AND FAXED TO [email or web address]                [facility]  PREOPERATIVE INSTRUCTIONS    Surgery Date:   07/09/22     Your surgeon's office or [facility] staff will call you between 4 PM- 8 PM the day before surgery with your arrival time. If your surgery is on a Monday, you will receive a call the preceding Friday. If your surgeon's office has given you, your arrival time then go by that time.    Please report to [facility] Patient Access/Admitting on the 1st floor.  Bring your insurance card, photo identification, and any copayment ( if applicable).   If you are going home the same day of your surgery, you must have a responsible adult to drive you home. You need to have a responsible adult to stay with you the first 24 hours after surgery and you should not drive a car for 24 hours following your surgery.  If you are being admitted to the hospital, please leave personal belongings/luggage in your car until you have an assigned hospital room number.  Do NOT drink alcohol or smoke 24 hours before surgery. STOP smoking for 14 days prior as it helps with breathing and healing after surgery.  Please wear comfortable clothes. Wear your glasses instead of contacts. We ask that all money, jewelry and valuables be left at home. Wear no make up, particularly mascara, the day of surgery.    All body piercings, rings, and jewelry need to be removed and left at home. Remove all nail polish except for clear. Please wear your hair loose or down, no pony-tails, buns, or any metal hair accessories. You may wear deodorant, unless having breast surgery.  Do not shave any body area within 24 hours of your surgery.  Please follow all instructions to avoid any potential surgical cancellation.  Should your physical condition change, (i.e. fever, cold, flu, etc.) please notify your surgeon as soon as possible.  It is important to be on time. If a situation occurs where you may be delayed, please  call:  [phone] / [phone] on the day of surgery.  The [facility] staff can be reached at [phone].  Special instructions: AND ANY INSTRUCTIONS FROM DR. NATHAN LEE OFFICE [phone]       Eating and Drinking Before Surgery    You may eat a regular dinner at the usual time on the day before your surgery.  Do NOT eat any solid foods after midnight.  You may drink clear liquids only from 12 midnight until 1 hours prior to your arrival time at the hospital on the day of your surgery. Do NOT drink alcohol.  Clear liquids include:  Water  Fruit juices without pulp( i.e. apple juice)  Carbonated beverages  Black coffee (no cream/milk)  Tea (no cream/milk)  Gatorade  You may drink up to 12-16 ounces at one time every 4 hours between the hours of midnight and 1 hour before your arrival time at the hospital. Example- if your arrival time at the hospital is 6am, you may drink 12-16 ounces of clear liquids no later than 5am.  If you have any questions, please contact your surgeon's office.    No current facility-administered medications for this encounter.     Current Outpatient Medications   Medication Sig    NONFORMULARY VITAMIN A 2400 DAILY    ibuprofen (ADVIL;MOTRIN) 200 MG tablet Take  1 tablet by mouth every 6 hours as needed for Pain    omeprazole (PRILOSEC) 20 MG delayed release capsule Take 1 capsule by mouth daily    amLODIPine (NORVASC) 10 MG tablet TAKE 1 TABLET BY MOUTH EVERY DAY    rosuvastatin (CRESTOR) 5 MG tablet Take 1 tablet by mouth daily    Multiple Vitamins-Minerals (CENTRUM SILVER 50+MEN PO) Centrum Silver    vitamin D (CHOLECALCIFEROL) 25 MCG (1000 UT) TABS tablet Take 1 tablet by mouth daily    cyanocobalamin 2000 MCG tablet Take by mouth daily    Flaxseed, Linseed, (FLAX SEED OIL) 1000 MG CAPS Take by mouth daily        YOU MUST ONLY TAKE THESE MEDICATIONS THE MORNING OF SURGERY  AMLODIPINE, ROSUVASTATIN, OMEPRAZOLE  MEDICATIONS TO TAKE THE MORNING OF SURGERY ONLY IF  NEEDED: TYLENOL  HOLD these prescription medications BEFORE Surgery: STOP VITAMIN A, MULTIVITAMIN AND FLAX SEED TODAY 07/07/22   Ask your surgeon/prescribing physician about when/if to STOP taking these medications: ANY YOU HAVE QUESTIONS ON .  (If you are currently taking Plavix, Coumadin,or any other blood-thinning/anticoagulant medication contact your prescribing physician for instructions).  Stop all vitamins, herbal medicines and Aspirin containing products 7 days prior to surgery. Stop any non-steroidal anti-inflammatory drugs (i.e. Ibuprofen, Naproxen, Advil, Aleve) 3 days before surgery. You may take Tylenol.              Preventing Infections Before and After - Your Surgery    IMPORTANT INSTRUCTIONS    You play an important role in your health and preparation for surgery. To reduce the germs on your skin you will need to shower with CHG soap (Chorhexidine gluconate 4%) two times before surgery.    CHG soap (Hibiclens, Hex-A-Clens or store brand)  If you do not have a PAT appointment before surgery, you may arrange to pick up CHG soap from our office or purchase CHG soap at a pharmacy, grocery or department store.  You need to purchase TWO 4 ounce bottles to use for your 2 showers.    Steps to follow:  Wash your hair with your normal shampoo and your body with regular soap and rinse well to remove shampoo and soap from your skin.  Wet a clean washcloth and turn off the shower.  Put CHG soap on washcloth and apply to your entire body from the neck down. Do not use on your head, face or private parts(genitals). Do not use CHG soap on open sores, wounds or areas of skin irritation.  Wash you body gently for 5 minutes. Do not wash your skin too hard. This soap does not create lather. Pay special attention to your underarms and from your belly button to your feet.  Turn the shower back on and rinse well to get CHG soap off your body.  Pat your skin dry with a clean, dry towel. Do not apply lotions or  moisturizer.  Put on clean clothes and sleep on fresh bed sheets and do not allow pets to sleep with you.    Shower with CHG soap 2 times before your surgery  The evening before your surgery  The morning of your surgery      Tips to help prevent infections after your surgery:  Protect your surgical wound from germs:  Hand washing is the most important thing you and your caregivers can do to prevent infections.  Keep your bandage clean and dry!  Do not touch your surgical wound.  Use clean, freshly  washed towels and washcloths every time you shower; do not share bath linens with others.  Until your surgical wound is healed, wear clothing and sleep on bed linens each day that are clean and freshly washed.  Do not allow pets to sleep in your bed with you or touch your surgical wound.  Do not smoke - smoking delays wound healing. This may be a good time to stop smoking.  If you have diabetes, it is important for you to manage your blood sugar levels properly before your surgery as well as after your surgery. Poorly managed blood sugar levels slow down wound healing and prevent you from healing completely.        Day of Procedure    Please park in the parking deck or any designated visitor parking lot.  Enter the facility through the Main Entrance of the hospital.  On the day of surgery, please provide the cell phone number of the person who will be waiting for you to the Patient Access representative at the time of registration.  Masks are highly recommended in the hospital, but not required.  Once your procedure and the immediate recovery period is completed, a nurse in the recovery area will contact your designated visitor to inform them of your room number or to otherwise review other pertinent information regarding your care.    Social distancing practices are strongly encouraged in waiting areas and the cafeteria.       The patient was contacted via telephone.   He verbalized understanding of all instructions does  not need reinforcement.

## 2022-07-09 ENCOUNTER — Inpatient Hospital Stay: Payer: BLUE CROSS/BLUE SHIELD | Attending: Surgery

## 2022-07-09 MED ORDER — ONDANSETRON HCL 4 MG/2ML IJ SOLN
4 MG/2ML | INTRAMUSCULAR | Status: DC | PRN
Start: 2022-07-09 — End: 2022-07-09
  Administered 2022-07-09: 18:00:00 4 via INTRAVENOUS

## 2022-07-09 MED ORDER — MEPERIDINE HCL 25 MG/ML IJ SOLN
25 MG/ML | INTRAMUSCULAR | Status: DC | PRN
Start: 2022-07-09 — End: 2022-07-09

## 2022-07-09 MED ORDER — GLYCOPYRROLATE 0.2 MG/ML IJ SOLN
0.2 MG/ML | INTRAMUSCULAR | Status: DC | PRN
Start: 2022-07-09 — End: 2022-07-09
  Administered 2022-07-09: 18:00:00 .2 via INTRAVENOUS
  Administered 2022-07-09: 19:00:00 .4 via INTRAVENOUS

## 2022-07-09 MED ORDER — PHENYLEPHRINE HCL 10 MG/ML SOLN (MIXTURES ONLY)
10 MG/ML | INTRAVENOUS | Status: DC | PRN
Start: 2022-07-09 — End: 2022-07-09
  Administered 2022-07-09: 18:00:00 20 via INTRAVENOUS

## 2022-07-09 MED ORDER — HYDROMORPHONE HCL PF 1 MG/ML IJ SOLN
1 MG/ML | INTRAMUSCULAR | Status: DC | PRN
Start: 2022-07-09 — End: 2022-07-09

## 2022-07-09 MED ORDER — NORMAL SALINE FLUSH 0.9 % IV SOLN
0.9 % | Freq: Two times a day (BID) | INTRAVENOUS | Status: DC
Start: 2022-07-09 — End: 2022-07-09

## 2022-07-09 MED ORDER — MIDAZOLAM HCL (PF) 2 MG/2ML IJ SOLN
2 MG/ML | Freq: Once | INTRAMUSCULAR | Status: DC | PRN
Start: 2022-07-09 — End: 2022-07-09

## 2022-07-09 MED ORDER — LACTATED RINGERS IV SOLN
INTRAVENOUS | Status: DC | PRN
Start: 2022-07-09 — End: 2022-07-09
  Administered 2022-07-09: 18:00:00 via INTRAVENOUS

## 2022-07-09 MED ORDER — BUPIVACAINE-EPINEPHRINE 0.5% -1:200000 IJ SOLN
INTRAMUSCULAR | Status: DC | PRN
Start: 2022-07-09 — End: 2022-07-09
  Administered 2022-07-09: 19:00:00 20 via INTRAMUSCULAR

## 2022-07-09 MED ORDER — NORMAL SALINE FLUSH 0.9 % IV SOLN
0.9 % | INTRAVENOUS | Status: DC | PRN
Start: 2022-07-09 — End: 2022-07-09

## 2022-07-09 MED ORDER — PROCHLORPERAZINE EDISYLATE 10 MG/2ML IJ SOLN
10 MG/2ML | Freq: Once | INTRAMUSCULAR | Status: DC | PRN
Start: 2022-07-09 — End: 2022-07-09

## 2022-07-09 MED ORDER — IBUPROFEN 600 MG PO TABS
600 MG | ORAL_TABLET | Freq: Three times a day (TID) | ORAL | 0 refills | Status: AC | PRN
Start: 2022-07-09 — End: ?

## 2022-07-09 MED ORDER — BUPIVACAINE HCL (PF) 0.5 % IJ SOLN
0.5 % | INTRAMUSCULAR | Status: AC
Start: 2022-07-09 — End: ?

## 2022-07-09 MED ORDER — ROCURONIUM BROMIDE 50 MG/5ML IV SOLN
50 MG/5ML | INTRAVENOUS | Status: DC | PRN
Start: 2022-07-09 — End: 2022-07-09
  Administered 2022-07-09: 18:00:00 30 via INTRAVENOUS

## 2022-07-09 MED ORDER — LIDOCAINE HCL (PF) 2 % IJ SOLN
2 % | INTRAMUSCULAR | Status: DC | PRN
Start: 2022-07-09 — End: 2022-07-09
  Administered 2022-07-09: 18:00:00 80 via INTRAVENOUS

## 2022-07-09 MED ORDER — PROPOFOL 100 MG/10ML IV EMUL
100 MG/10ML | INTRAVENOUS | Status: DC | PRN
Start: 2022-07-09 — End: 2022-07-09
  Administered 2022-07-09: 18:00:00 200 via INTRAVENOUS

## 2022-07-09 MED ORDER — ONDANSETRON HCL 4 MG/2ML IJ SOLN
4 MG/2ML | Freq: Once | INTRAMUSCULAR | Status: DC
Start: 2022-07-09 — End: 2022-07-09

## 2022-07-09 MED ORDER — NEOSTIGMINE METHYLSULFATE 3 MG/3ML IV SOSY
3 MG/ML | INTRAVENOUS | Status: DC | PRN
Start: 2022-07-09 — End: 2022-07-09
  Administered 2022-07-09: 19:00:00 3 via INTRAVENOUS

## 2022-07-09 MED ORDER — SODIUM CHLORIDE 0.9 % IV SOLN
0.9 % | INTRAVENOUS | Status: DC | PRN
Start: 2022-07-09 — End: 2022-07-09

## 2022-07-09 MED ORDER — SODIUM CHLORIDE 0.9 % IV SOLN
0.9 % | INTRAVENOUS | Status: DC
Start: 2022-07-09 — End: 2022-07-09

## 2022-07-09 MED ORDER — ONDANSETRON HCL 4 MG/2ML IJ SOLN
4 MG/2ML | Freq: Once | INTRAMUSCULAR | Status: DC | PRN
Start: 2022-07-09 — End: 2022-07-09

## 2022-07-09 MED ORDER — FENTANYL CITRATE PF 50 MCG/ML IJ SOSY
50 MCG/ML | INTRAMUSCULAR | Status: DC | PRN
Start: 2022-07-09 — End: 2022-07-09
  Administered 2022-07-09: 18:00:00 100 via INTRAVENOUS

## 2022-07-09 MED ORDER — LACTATED RINGERS IV SOLN
INTRAVENOUS | Status: DC
Start: 2022-07-09 — End: 2022-07-09
  Administered 2022-07-09: 16:00:00 via INTRAVENOUS

## 2022-07-09 MED ORDER — FENTANYL CITRATE (PF) 100 MCG/2ML IJ SOLN
100 MCG/2ML | Freq: Once | INTRAMUSCULAR | Status: DC | PRN
Start: 2022-07-09 — End: 2022-07-09

## 2022-07-09 MED ORDER — MIDAZOLAM HCL 2 MG/2ML IJ SOLN
2 MG/ML | INTRAMUSCULAR | Status: DC | PRN
Start: 2022-07-09 — End: 2022-07-09
  Administered 2022-07-09: 18:00:00 2 via INTRAVENOUS

## 2022-07-09 MED ORDER — OXYCODONE-ACETAMINOPHEN 5-325 MG PO TABS
5-325 MG | ORAL_TABLET | Freq: Four times a day (QID) | ORAL | 0 refills | Status: AC | PRN
Start: 2022-07-09 — End: 2022-07-16

## 2022-07-09 MED ORDER — DIPHENHYDRAMINE HCL 50 MG/ML IJ SOLN
50 MG/ML | Freq: Once | INTRAMUSCULAR | Status: DC | PRN
Start: 2022-07-09 — End: 2022-07-09

## 2022-07-09 MED ORDER — LACTATED RINGERS IV SOLN
INTRAVENOUS | Status: DC
Start: 2022-07-09 — End: 2022-07-09

## 2022-07-09 MED ORDER — FENTANYL CITRATE (PF) 100 MCG/2ML IJ SOLN
100 MCG/2ML | INTRAMUSCULAR | Status: DC | PRN
Start: 2022-07-09 — End: 2022-07-09
  Administered 2022-07-09: 19:00:00 50 ug via INTRAVENOUS

## 2022-07-09 MED ORDER — CEFAZOLIN SODIUM 1 G IJ SOLR
1 g | INTRAMUSCULAR | Status: AC
Start: 2022-07-09 — End: 2022-07-09
  Administered 2022-07-09: 18:00:00 2000 mg via INTRAVENOUS

## 2022-07-09 MED ORDER — DEXAMETHASONE 4 MG/ML IJ SOLN (MIXTURES ONLY)
4 MG/ML | INTRAMUSCULAR | Status: DC | PRN
Start: 2022-07-09 — End: 2022-07-09
  Administered 2022-07-09: 18:00:00 8 via INTRAVENOUS

## 2022-07-09 MED ORDER — HYDRALAZINE HCL 20 MG/ML IJ SOLN
20 MG/ML | INTRAMUSCULAR | Status: DC | PRN
Start: 2022-07-09 — End: 2022-07-09

## 2022-07-09 MED FILL — LACTATED RINGERS IV SOLN: INTRAVENOUS | Qty: 1000

## 2022-07-09 MED FILL — FENTANYL CITRATE (PF) 100 MCG/2ML IJ SOLN: 100 MCG/2ML | INTRAMUSCULAR | Qty: 2

## 2022-07-09 MED FILL — SODIUM CHLORIDE 0.9 % IV SOLN: 0.9 % | INTRAVENOUS | Qty: 1000

## 2022-07-09 MED FILL — CEFAZOLIN SODIUM 1 G IJ SOLR: 1 g | INTRAMUSCULAR | Qty: 2000

## 2022-07-09 MED FILL — SENSORCAINE-MPF 0.5 % IJ SOLN: 0.5 % | INTRAMUSCULAR | Qty: 30

## 2022-07-09 MED FILL — NORMAL SALINE FLUSH 0.9 % IV SOLN: 0.9 % | INTRAVENOUS | Qty: 40

## 2022-07-09 NOTE — Discharge Instructions (Addendum)
Umbilical Hernia Repair    Patient Discharge Instructions    Jay Lee / [id number] DOB: May 27, 1961    Admitted 07/09/2022 Discharged: 07/09/2022       It is important that you take the medication exactly as they are prescribed.   Keep your medication in the bottles provided by the pharmacist and keep a list of the medication names, dosages, and times to be taken in your wallet.   Do not take other medications without consulting your doctor.   Wound care:  May shower at home starting tomorrow,  do not remove the dermabond covering the wound.  It will fall off on its own in a few weeks.  No heavy lifting or strenuous activity for 2 wks      What to do at Home    See detailed instructions below.    Follow-up with Dr. Nedra Hai in 2 week(s). Call the office [phone]) to schedule your appointment.        Information obtained by :  I understand that if any problems occur once I am at home I am to contact my physician.    I understand and acknowledge receipt of the instructions indicated above.                                                                                                                                           Physician's or R.N.'s Signature                                                                  Date/Time                                                                                                                                              Patient or Energy manager  Date/Time     Umbilical Hernia Repair: What to Expect at [facility]  After surgery to repair your hernia, you are likely to have pain for a few days. You may also feel like you have the flu, and you may have a low fever and feel tired and nauseated. This is common.  You should feel better after a few days and will probably feel much better in 7 days.  For several weeks you may feel twinges or pulling in the hernia repair when you move. You may have some  bruising around the area of your hernia repair. This is normal.  This care sheet gives you a general idea about how long it will take for you to recover. But each person recovers at a different pace. Follow the steps below to get better as quickly as possible.  How can you care for yourself at home?  Activity  Rest when you feel tired. Getting enough sleep will help you recover. Sleep with your head up by using three or four pillows. You can also try to sleep with your head up in a recliner chair. Do not sleep on your side or stomach.  Try to walk each day. Start by walking a little more than you did the day before. Bit by bit, increase the amount you walk. Walking boosts blood flow and helps prevent pneumonia and constipation.  Put ice or a cold pack on the area of your hernia repair for 10 to 20 minutes at a time. Try to do this every 1 to 2 hours for the first 24 hours (when you are awake) or until the swelling goes down. Put a thin cloth between the ice and your skin.  If your doctor gives you an abdominal binder to wear, use it as directed. This is an elastic bandage that wraps around your belly and upper hips. It helps support your belly muscles after surgery.  Avoid strenuous activities, such as biking, jogging, weight lifting, or aerobic exercise, until your doctor says it is okay.  Avoid lifting anything that would make you strain. This may include heavy grocery bags and milk containers, a heavy briefcase or backpack, cat litter or dog food bags, a vacuum cleaner, or a child.  Ask your doctor when you can drive again.  Most people are able to return to work within 1 to 2 weeks after surgery. But if your job requires that you to do heavy lifting or strenuous activity, you may need to take 4 to 6 weeks off from work.  You may shower 24 to 48 hours after surgery, if your doctor okays it. Pat the cut (incision) dry. Do not take a bath for the first 2 weeks, or until your doctor tells you it is okay.  Ask your  doctor when it is okay for you to have sex.  Diet  You can eat your normal diet. If your stomach is upset, try bland, low-fat foods like plain rice, broiled chicken, toast, and yogurt.  Drink plenty of fluids (unless your doctor tells you not to).  You may notice that your bowel movements are not regular right after your surgery. This is common. Avoid constipation and straining with bowel movements. You may want to take a fiber supplement every day. If you have not had a bowel movement after a couple of days, ask your doctor about taking a mild laxative.  Medicines  Take pain medicines exactly as directed.  If the doctor gave you a  prescription medicine for pain, take it as prescribed.  If you are not taking a prescription pain medicine, ask your doctor if you can take an over-the-counter medicine.  Do not take two or more pain medicines at the same time unless the doctor told you to. Many pain medicines have acetaminophen, which is Tylenol. Too much acetaminophen (Tylenol) can be harmful.  If your doctor prescribed antibiotics, take them as directed. Do not stop taking them just because you feel better. You need to take the full course of antibiotics.  If you think your pain medicine is making you sick to your stomach:  Take your medicine after meals (unless your doctor has told you not to).  Ask your doctor for a different pain medicine.  Incision care  Wash the area daily with warm, soapy water, and pat it dry. Don't use hydrogen peroxide or alcohol, which may delay healing. You may cover the area with a gauze bandage if it weeps or rubs against clothing. Change the bandage every day.  Other instructions  Hold a pillow over your incision when you cough or take deep breaths. This will support your belly and decrease your pain.  Do breathing exercises at home as instructed by your doctor. This will help prevent pneumonia.  If you had laparoscopic surgery, you may also have pain in your left shoulder. The pain  usually lasts about a day or two.  Follow-up care is a key part of your treatment and safety. Be sure to make and go to all appointments, and call your doctor if you are having problems. It's also a good idea to know your test results and keep a list of the medicines you take.  When should you call for help?  Call 911 anytime you think you may need emergency care. For example, call if:  You passed out (lost consciousness).  You have sudden chest pain and shortness of breath, or you cough up blood.  You have severe pain in your belly.  Call your doctor now or seek immediate medical care if:  You are sick to your stomach and cannot keep fluids down.  You have signs of a blood clot, such as:  Pain in your calf, back of the knee, thigh, or groin.  Redness and swelling in your leg or groin.  You have signs of infection, such as:  Increased pain, swelling, warmth, or redness.  Red streaks leading from the incision.  Pus draining from the incision.  Swollen lymph nodes in the groin.  A fever.  You have trouble passing urine or stool, especially if you have mild pain or swelling in your lower belly.  Bright red blood has soaked through the bandage over your incision.  Watch closely for changes in your health, and be sure to contact your doctor if:  Your swelling is getting worse.  Your swelling is not going down.  You still don't have a bowel movement after taking a laxative.   Where can you learn more?   Go to [employer]  Enter B577 in the search box to learn more about "Abdominal Hernia Repair: What to Expect at Home."    1995-2010 [employer]. Care instructions adapted under license by [employer] (which disclaims liability or warranty for this information). This care instruction is for use with your licensed healthcare professional. If you have questions about a medical condition or this instruction, always ask your healthcare professional. Healthwise disclaims any warranty or  liability for your use of this information.  Content Version: 8.8.72453; Last Revised: April 22, 2009       ______________________________________________________________________    Anesthesia Discharge Instructions    After general anesthesia or intervenous sedation, for 24 hours or while taking prescription Narcotics:  Limit your activities  Do not drive or operate hazardous machinery  If you have not urinated within 8 hours after discharge, please contact your surgeon on call.  Do not make important personal or business decisions  Do not drink alcoholic beverages    Report the following to your surgeon:  Excessive pain, swelling, redness or odor of or around the surgical area  Temperature over 100.5 degrees  Nausea and vomiting lasting longer than 4 hours or if unable to take medication  Any signs of decreased circulation or nerve impairment to extremity:  Change in color, persistent numbness, tingling, coldness or increased pain.  Any questions

## 2022-07-09 NOTE — Brief Op Note (Signed)
Brief Postoperative Note      Patient: Jay Lee  Date of Birth: Sep 02, 1960  MRN: [id number]    Date of Procedure: August 08, 2022    Pre-Op Diagnosis Codes:     * Umbilical hernia without obstruction and without gangrene [K42.9]    Post-Op Diagnosis: Same       Procedure(s):  OPEN UMBILICAL HERNIA REPAIR    Surgeon(s):  Ree Kida, MD    Assistant:  Surgical Assistant: Rondell Reams    Anesthesia: General    Estimated Blood Loss (mL): Minimal    Complications: None    Specimens:   * No specimens in log *    Implants:  * No implants in log *      Drains: * No LDAs found *    Findings: small 57mm umbilical hernia defect repaired primarily      Electronically signed by Debbe Bales, MD on 08-08-22 at 1:44 PM

## 2022-07-09 NOTE — Op Note (Signed)
[facility]  OPERATIVE REPORT    Name:  [name], [name]  MR#:  [id number]  DOB:  1961-03-07  ACCOUNT #:  [id number]  DATE OF SERVICE:  07/09/2022    PREOPERATIVE DIAGNOSIS:  Umbilical hernia.    POSTOPERATIVE DIAGNOSIS:  Umbilical hernia.    PROCEDURE PERFORMED:  Open umbilical hernia repair.    SURGEON:  Ree Kida, MD    ASSISTANT:  Rondell Reams, SA    ANESTHESIA:  General.    COMPLICATIONS:  None.    SPECIMENS REMOVED:  None.    IMPLANTS:  None.    DRAINS:  None.    ESTIMATED BLOOD LOSS:  Minimal.    FINDINGS:  Small 6-mm umbilical hernia defect repaired primarily.    INDICATIONS:  The patient is a 61 year old male who has a symptomatic umbilical hernia that is needing repair in the operating room.    PROCEDURE:  The patient was met in the preoperative holding area.  H and P was updated.  Consent was signed.  All risks and benefits were explained to the patient prior to the start of the operation.  He was taken back to the operating room.  He was lying in a supine position.  The abdomen was prepped and draped in standard sterile fashion.  Time-out was called.  Antibiotics were given.  SCDs were on lower extremities.  I started the operation by making an infraumbilical curvilinear incision down below the umbilicus.  We dissected through the subcutaneous tissue with the Bovie cautery, dissecting all the way down to the base of the umbilical stalk.  We then dissected 360 degrees around the base of the umbilical stalk with a Kelly clamp.  We then lifted the umbilicus off the infraumbilical fascia with the Bovie cautery, making sure we did not buttonhole the skin.  We exposed the umbilical hernia which had some preperitoneal fat coming through the defect.  That fat was reduced back into the abdominal cavity.  We then isolated the fascial edges a little bit further.  The umbilical hernia defect was about 6 mm in size and we would close this primarily.  There was no need for mesh.  We would then close  the hernia with multiple interrupted 0 Ethibond sutures in a figure-of-eight fashion using three sutures.  The hernia defect was repaired primarily.  We then irrigated with saline irrigation.  The wound was hemostatic.  There was no bleeding.  We then tacked the umbilicus down to the infraumbilical stalk with a 2-0 Vicryl suture and then closed the deep dermal layer of the wound with multiple interrupted 3-0 Vicryl sutures, and closed the skin with a running 4-0 Monocryl and Dermabond to complete the operation.  Dr. Truman Hayward was present and scrubbed during the entire operation.  The counts were correct.      Ree Kida, MD      [id number]  D:  07/09/2022 13:51  T:  07/09/2022 15:17  JOB #:  [id number]

## 2022-07-09 NOTE — Anesthesia Pre-Procedure Evaluation (Signed)
Department of Anesthesiology  Preprocedure Note       Name:  Jay Lee   Age:  61 y.o.  DOB:  1961-05-29                                          MRN:  [id number]         Date:  07/09/2022      Surgeon: Juliann Mule):  Ree Kida, MD    Procedure: Procedure(s):  OPEN UMBILICAL HERNIA REPAIR    Medications prior to admission:   Prior to Admission medications    Medication Sig Start Date End Date Taking? Authorizing Provider   NONFORMULARY VITAMIN A 2400 DAILY   Yes [provider]   ibuprofen (ADVIL;MOTRIN) 200 MG tablet Take 1 tablet by mouth every 6 hours as needed for Pain   Yes [provider]   omeprazole (PRILOSEC) 20 MG delayed release capsule Take 1 capsule by mouth daily 04/21/22   Frances Maywood, MD   amLODIPine (NORVASC) 10 MG tablet TAKE 1 TABLET BY MOUTH EVERY DAY 04/14/22   Frances Maywood, MD   rosuvastatin (CRESTOR) 5 MG tablet Take 1 tablet by mouth daily 03/08/22   Frances Maywood, MD   Multiple Vitamins-Minerals (CENTRUM SILVER 50+MEN PO) Centrum Silver    [provider]   vitamin D (CHOLECALCIFEROL) 25 MCG (1000 UT) TABS tablet Take 1 tablet by mouth daily    Automatic Reconciliation, Ar   cyanocobalamin 2000 MCG tablet Take by mouth daily    Automatic Reconciliation, Ar   Flaxseed, Linseed, (FLAX SEED OIL) 1000 MG CAPS Take by mouth daily    Automatic Reconciliation, Ar       Current medications:    Current Facility-Administered Medications   Medication Dose Route Frequency Provider Last Rate Last Admin   . ceFAZolin (ANCEF) 2,000 mg in sterile water 20 mL IV syringe  2,000 mg IntraVENous On Call to OR Ree Kida, MD       . fentaNYL (SUBLIMAZE) injection 100 mcg  100 mcg IntraVENous Once PRN Victors, Hope Pigeon, DO       . ondansetron [facility]) injection 4 mg  4 mg IntraVENous Once Victors, Thomas R, DO       . 0.9 % sodium chloride infusion   IntraVENous Continuous Victors, Hope Pigeon, DO       . lactated ringers IV soln infusion   IntraVENous  Continuous Timmothy Euler, DO 125 mL/hr at 07/09/22 1106 New Bag at 07/09/22 1106   . sodium chloride flush 0.9 % injection 5-40 mL  5-40 mL IntraVENous 2 times per day Victors, Thomas R, DO       . sodium chloride flush 0.9 % injection 5-40 mL  5-40 mL IntraVENous PRN Victors, Thomas R, DO       . 0.9 % sodium chloride infusion   IntraVENous PRN Victors, Thomas R, DO       . midazolam PF (VERSED) injection 2 mg  2 mg IntraVENous Once PRN Victors, Thomas R, DO           Allergies:  No Known Allergies    Problem List:    Patient Active Problem List   Diagnosis Code   . Essential (primary) hypertension I10   . Mixed hyperlipidemia E78.2   . 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 Submandibular swelling R22.0, R22.1   .  GERD (gastroesophageal reflux disease) K21.9       Past Medical History:        Diagnosis Date   . GERD (gastroesophageal reflux disease)    . High cholesterol    . Hypertension        Past Surgical History:        Procedure Laterality Date   . COLONOSCOPY  2020   . ORTHOPEDIC SURGERY Right     FOREARM   . ORTHOPEDIC SURGERY Right     ELBOW   . ORTHOPEDIC SURGERY Left     shoulder replacement   . ORTHOPEDIC SURGERY Right     rotator cuff   . TOTAL KNEE ARTHROPLASTY Right        Social History:    Social History     Tobacco Use   . Smoking status: Never   . Smokeless tobacco: Never   Substance Use Topics   . Alcohol use: Yes     Comment: 2 days/week, 12 PACK                                Counseling given: Not Answered      Vital Signs (Current):   Vitals:    07/07/22 1341 07/09/22 1045   BP:  (!) 156/83   Pulse:  62   Resp:  16   Temp:  98.1 F (36.7 C)   TempSrc:  Oral   SpO2:  98%   Weight: 86.2 kg (190 lb) 86.2 kg (190 lb)   Height: 1.727 m (5\' 8" ) 1.727 m (5\' 8" )                                              BP Readings from Last 3 Encounters:   07/09/22 (!) 156/83   06/17/22 (!) 181/82   06/11/22 (!) 148/79       NPO Status: Time of last liquid consumption: 0800 (water)                        Time  of last solid consumption: 2200                        Date of last liquid consumption: 07/09/22                        Date of last solid food consumption: 07/08/22    BMI:   Wt Readings from Last 3 Encounters:   07/09/22 86.2 kg (190 lb)   06/17/22 87 kg (191 lb 12.8 oz)   06/11/22 86.6 kg (191 lb)     Body mass index is 28.89 kg/m.    CBC:   Lab Results   Component Value Date/Time    WBC 5.5 04/09/2022 12:00 AM    RBC 4.94 04/09/2022 12:00 AM    HGB 15.1 04/09/2022 12:00 AM    HCT 44.6 04/09/2022 12:00 AM    MCV 90 04/09/2022 12:00 AM    RDW 13.1 04/09/2022 12:00 AM    PLT 227 04/09/2022 12:00 AM       CMP:   Lab Results   Component Value Date/Time    NA 142 04/09/2022 12:00 AM    K 4.2 04/09/2022 12:00 AM  CL 107 04/09/2022 12:00 AM    CO2 22 04/09/2022 12:00 AM    BUN 16 04/09/2022 12:00 AM    CREATININE 0.80 04/09/2022 12:00 AM    GFRAA 109 01/17/2020 10:02 AM    AGRATIO 1.9 04/09/2022 12:00 AM    LABGLOM 100 04/09/2022 12:00 AM    GLUCOSE 90 04/09/2022 12:00 AM    PROT 6.9 04/09/2022 12:00 AM    CALCIUM 9.3 04/09/2022 12:00 AM    BILITOT 0.6 04/09/2022 12:00 AM    ALKPHOS 57 04/09/2022 12:00 AM    AST 23 04/09/2022 12:00 AM    ALT 21 04/09/2022 12:00 AM       POC Tests: No results for input(s): "POCGLU", "POCNA", "POCK", "POCCL", "POCBUN", "POCHEMO", "POCHCT" in the last 72 hours.    Coags: No results found for: "PROTIME", "INR", "APTT"    HCG (If Applicable): No results found for: "PREGTESTUR", "PREGSERUM", "HCG", "HCGQUANT"     ABGs: No results found for: "PHART", "PO2ART", "PCO2ART", "HCO3ART", "BEART", "O2SATART"     Type & Screen (If Applicable):  No results found for: "LABABO", "LABRH"    Drug/Infectious Status (If Applicable):  Lab Results   Component Value Date/Time    HEPCAB Non Reactive 04/09/2022 12:00 AM       COVID-19 Screening (If Applicable): No results found for: "COVID19"        Anesthesia Evaluation  Patient summary reviewed and Nursing notes reviewed no history of anesthetic  complications:   Airway: Mallampati: III  TM distance: >3 FB   Neck ROM: full  Mouth opening: > = 3 FB   Dental: normal exam   (+) caps      Pulmonary:Negative Pulmonary ROS and normal exam  breath sounds clear to auscultation                             Cardiovascular:Negative CV ROS  Exercise tolerance: good (>4 METS),   (+) hypertension:, hyperlipidemia        Rhythm: regular  Rate: normal                    Neuro/Psych:   Negative Neuro/Psych ROS              GI/Hepatic/Renal: Neg GI/Hepatic/Renal ROS  (+) GERD:,           Endo/Other: Negative Endo/Other ROS   (+) hypothyroidism::., .                 Abdominal: normal exam            Vascular: negative vascular ROS.         Other Findings:           Anesthesia Plan      general     ASA 2       Induction: intravenous.    MIPS: Postoperative opioids intended and Prophylactic antiemetics administered.  Anesthetic plan and risks discussed with patient.    Use of blood products discussed with patient whom consented to blood products.   Plan discussed with CRNA and surgical team.    Attending anesthesiologist reviewed and agrees with Preprocedure content                Sammuel Hines, MD   07/09/2022

## 2022-07-09 NOTE — Anesthesia Post-Procedure Evaluation (Signed)
Department of Anesthesiology  Postprocedure Note    Patient: Jay Lee  MRN: [id number]  Birthdate: [birth year]  Date of evaluation: 07/09/2022      Procedure Summary     Date: 07/09/22 Room / Location: [facility] MAIN OR 09 / [place] MAIN OR    Anesthesia Start: 1255 Anesthesia Stop: 5427    Procedure: OPEN UMBILICAL HERNIA REPAIR (Abdomen) Diagnosis:       Umbilical hernia without obstruction and without gangrene      (Umbilical hernia without obstruction and without gangrene [K42.9])    Providers: Ree Kida, MD Responsible Provider: Lorelee New, MD    Anesthesia Type: General ASA Status: 2          Anesthesia Type: General    Aldrete Phase I: Aldrete Score: 10    Aldrete Phase II:        Anesthesia Post Evaluation    Patient location during evaluation: PACU  Patient participation: complete - patient participated  Level of consciousness: awake  Pain score: 2  Airway patency: patent  Nausea & Vomiting: no nausea  Complications: no  Cardiovascular status: blood pressure returned to baseline  Respiratory status: acceptable  Hydration status: euvolemic  Pain management: adequate

## 2022-07-09 NOTE — Interval H&P Note (Signed)
Update History & Physical    The surgery was reviewed with the patient and I examined the patient. There was no change. The surgical site was confirmed by the patient and me.     Plan: The risks, benefits, expected outcome, and alternative to the recommended procedure have been discussed with the patient. Patient understands and wants to proceed with the procedure.     Electronically signed by Debbe Bales, MD on 07/09/2022 at 12:07 PM

## 2022-07-11 ENCOUNTER — Encounter

## 2022-07-12 MED ORDER — ROSUVASTATIN CALCIUM 5 MG PO TABS
5 MG | ORAL_TABLET | Freq: Every day | ORAL | 1 refills | Status: AC
Start: 2022-07-12 — End: 2023-02-22

## 2022-07-19 ENCOUNTER — Ambulatory Visit
Admit: 2022-07-19 | Discharge: 2022-07-19 | Payer: BLUE CROSS/BLUE SHIELD | Attending: Surgery | Primary: Student in an Organized Health Care Education/Training Program

## 2022-07-19 DIAGNOSIS — K429 Umbilical hernia without obstruction or gangrene: Secondary | ICD-10-CM

## 2022-07-19 NOTE — Progress Notes (Signed)
Subjective:      Jay Lee is a 61 y.o. male presents for postop care 2 wk(s) following open umbilical hernia repair.  He is doing well having minimal pain the wound is healing well and eating and drinking well.      Objective:     BP (!) 164/89 (Site: Left Upper Arm, Position: Sitting, Cuff Size: Medium Adult)   Pulse 67   Temp 98.9 F (37.2 C) (Oral)   Resp 20   Ht 1.727 m (5\' 8" )   Wt 88.9 kg (196 lb)   SpO2 95%   BMI 29.80 kg/m     General:  alert, cooperative, no distress, appears stated age   Abdomen: soft, non-tender   Incision:   healing well, no drainage, no erythema, no hernia, no seroma, no swelling, no dehiscence, incision well approximated     Assessment:     1. Jay Lee is a 61 y.o. male who is s/p open umbilical hernia repair      Plan:     1. Pt is to increase activities as tolerated..  2.  He is doing well the incision looks great, minimal pain  3. Follow-up prn    Jay Lee has a reminder for a "due or due soon" health maintenance. I have asked that he contact his primary care provider for follow-up on this health maintenance.

## 2022-07-19 NOTE — Progress Notes (Signed)
Identified patient with two patient identifiers (name and DOB). Reviewed chart in preparation for visit and have obtained necessary documentation.    Jay Lee is a 61 y.o. male  Chief Complaint   Patient presents with    Post-Op Check     44/81/85 OPEN UMBILICAL HERNIA REPAIR     BP (!) 164/89 (Site: Left Upper Arm, Position: Sitting, Cuff Size: Medium Adult)   Pulse 67   Temp 98.9 F (37.2 C) (Oral)   Resp 20   Ht 1.727 m (5\' 8" )   Wt 88.9 kg (196 lb)   SpO2 95%   BMI 29.80 kg/m     1. Have you been to the ER, urgent care clinic since your last visit?  Hospitalized since your last visit?no    2. Have you seen or consulted any other health care providers outside of the [place] since your last visit?  Include any pap smears or colon screening. no

## 2022-07-29 IMAGING — MR MRI LUMBAR SPINE WITHOUT CONTRAST
5 of 8 series · 26 of 48 positions shown · non-contrast
Comparison: None.

FINAL REPORT:
MRI LUMBAR SPINE WITHOUT CONTRAST
INDICATION: Spinal stenosis.
TECHNIQUE: Multiplanar multisequence MR imaging of the lumbar spine was performed without contrast

[Series 5: T2 · sagittal · 4.0mm · 0.73mm/px · 4 of 18 slices shown (1 of 2)]
[im 1/18]
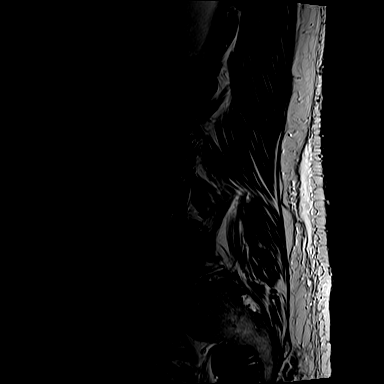
[im 6/18]
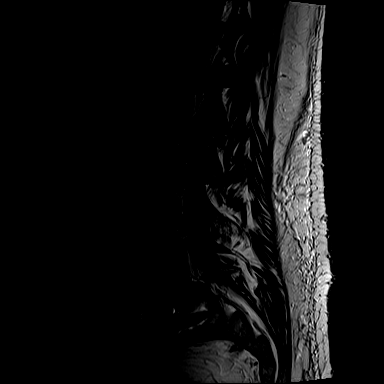
[im 12/18]
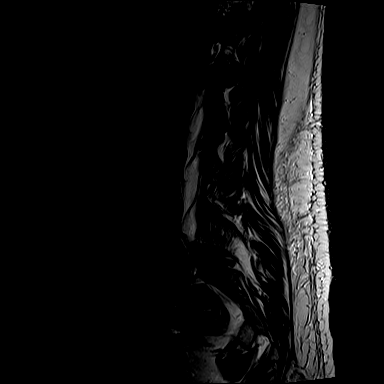
[im 18/18]
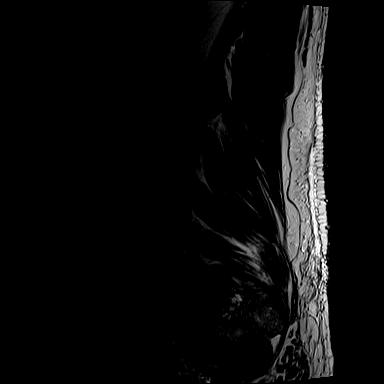

[Series 6: STIR · sagittal · 4.0mm · 0.83mm/px · 4 of 18 slices shown]
[im 1/18]
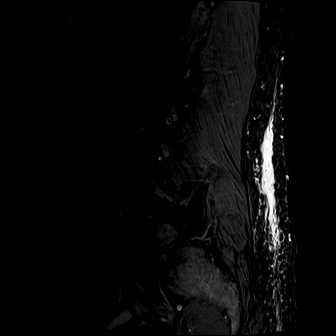
[im 6/18]
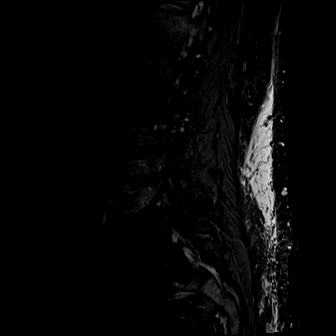
[im 12/18]
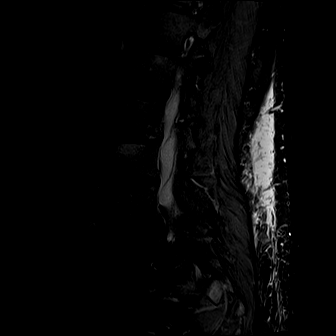
[im 18/18]
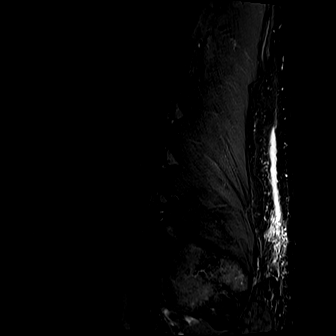

[Series 7: T1 · sagittal · 4.0mm · 0.73mm/px · 4 of 18 slices shown (1 of 2)]
[im 1/18]
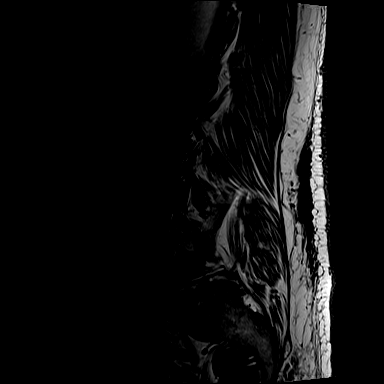
[im 6/18]
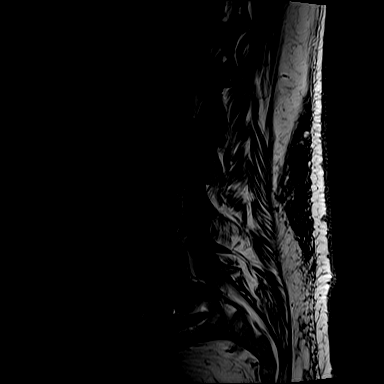
[im 12/18]
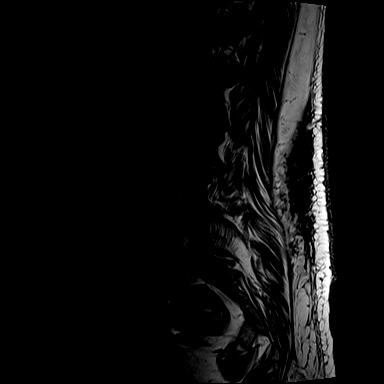
[im 18/18]
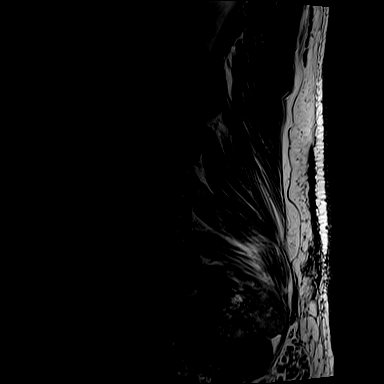

[Series 8: T2 · axial · 4.0mm · 0.57mm/px · z∈[-176,+53]mm · 7 of 33 slices shown (2 of 2)]
[im 1/33]
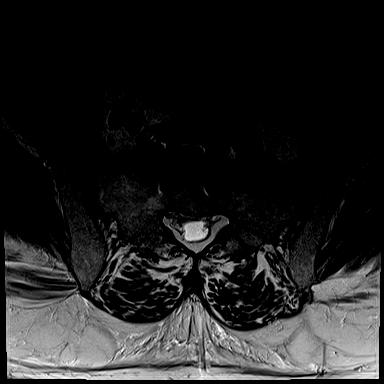
[im 6/33]
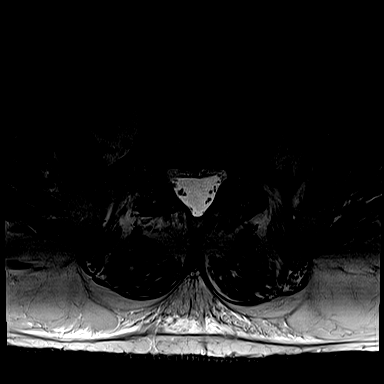
[im 11/33]
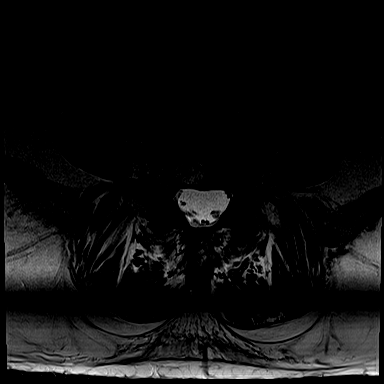
[im 17/33]
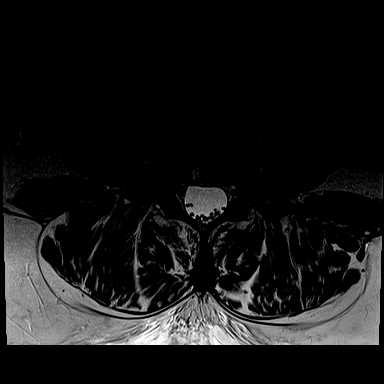
[im 22/33]
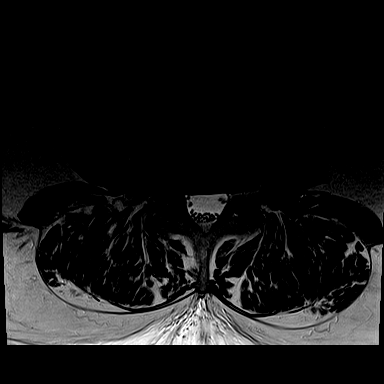
[im 27/33]
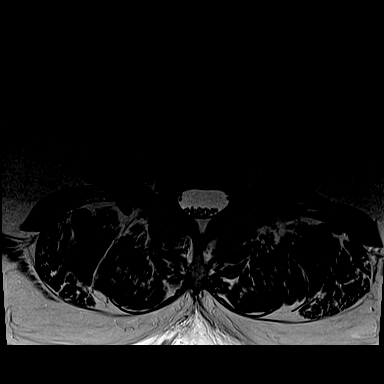
[im 33/33]
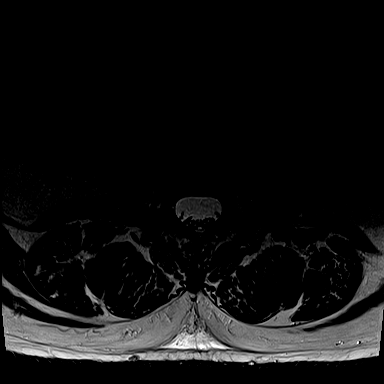

[Series 9: T1 · axial · 4.0mm · 0.57mm/px · z∈[-176,+53]mm · 7 of 33 slices shown (2 of 2)]
[im 1/33]
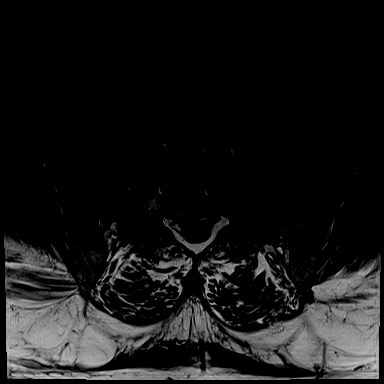
[im 6/33]
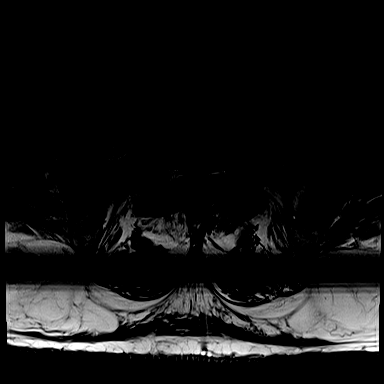
[im 11/33]
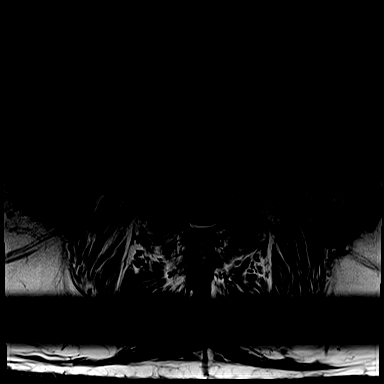
[im 17/33]
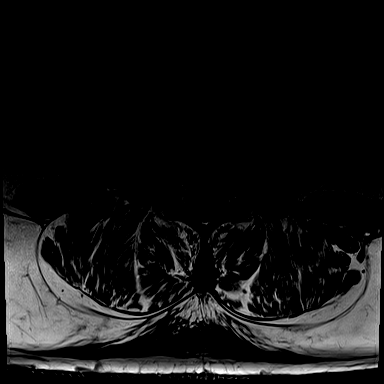
[im 22/33]
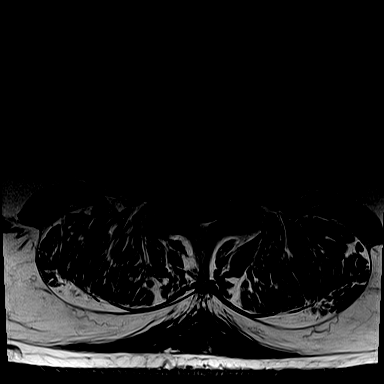
[im 27/33]
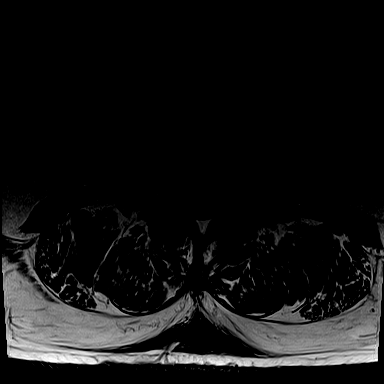
[im 33/33]
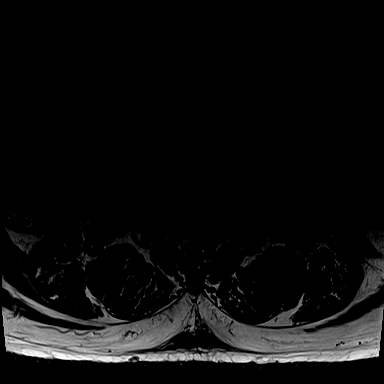

[26 of 48 positions shown; findings below may reference images not displayed]

FINDINGS: There are 5 lumbar-type vertebral bodies. Trace anterolisthesis L2-3 and trace retrolisthesis at L4-5. Small Schmorl's nodes at several levels, the vertebral body heights are otherwise preserved. Interbody fusion at L5-S1. Disc space narrowing L2-3 with fibrovascular endplate changes. Fibrovascular endplate changes L3-4 without significant disc space narrowing. Disc desiccation L1-2 through L4-5. No acute fracture. No suspicious marrow lesions.
The imaged portions of the cord are normal. The conus tip ends at L1 and is not compressed or expanded.
L1-2: Mild facet hypertrophy with no significant canal or neural foraminal stenosis.
L2-3: Small disc bulge with mild-to-moderate facet hypertrophy causing mild left and moderate right neural foraminal narrowing. Mild canal stenosis.
L3-4: Small disc bulge with mild facet hypertrophy causing mild bilateral neural foraminal narrowing. No significant canal stenosis.
L4-5: Small disc bulge with mild facet hypertrophy causing mild bilateral neural foraminal narrowing. No significant canal stenosis. There is a 7 mm midline ligamentum flavum cyst (image 10 series 6).
L5-S1: Moderate facet hypertrophy and small disc osteophyte complex causing mild bilateral neural foraminal narrowing. No canal stenosis.
The prevertebral soft tissues are unremarkable.
IMPRESSION: 
IMPRESSION: 1. Post surgical changes from interbody fusion at L5-S1.
2. Mild canal stenosis with moderate right neural foraminal narrowing at L2-3. No compression of the neural structures.
3. Fibrovascular endplate changes L2-3 and L3-4.

## 2022-07-29 IMAGING — MR MRI CERVICAL SPINE WITHOUT CONTRAST
8 series · 48 of 48 positions shown · non-contrast
Comparison: None

FINAL REPORT:
EXAM: MR Cervical Spine Without Contrast,
HISTORY: Sciatica, unspecified side
TECHNIQUE: Multisequence multiplanar images of the cervical spine were obtained.

[Series 1: survey · coronal · 1.7mm · 1.67mm/px · 18 of 96 slices shown]
[im 1/96]
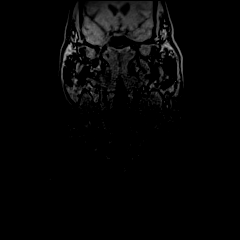
[im 6/96]
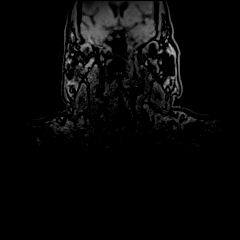
[im 12/96]
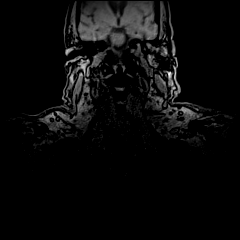
[im 17/96]
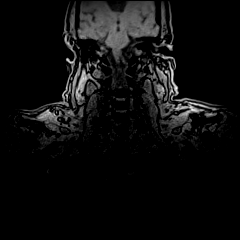
[im 23/96]
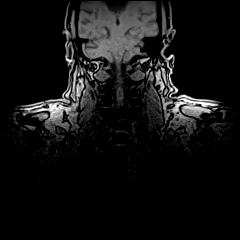
[im 28/96]
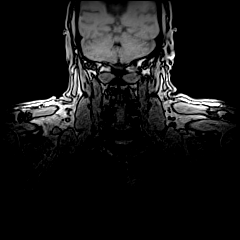
[im 34/96]
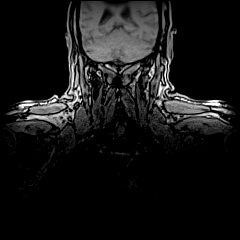
[im 40/96]
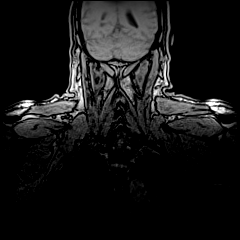
[im 45/96]
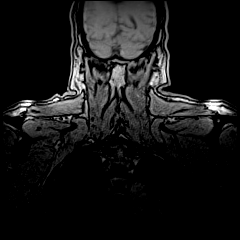
[im 51/96]
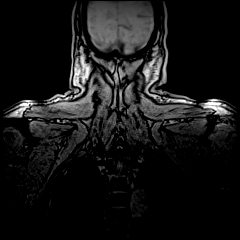
[im 56/96]
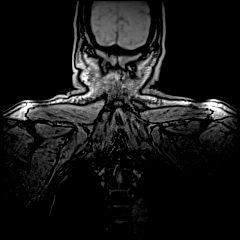
[im 62/96]
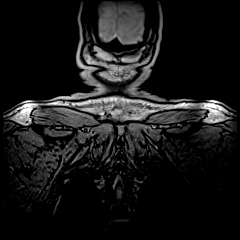
[im 68/96]
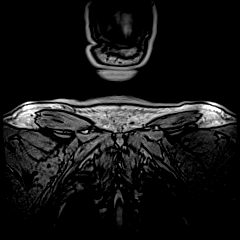
[im 73/96]
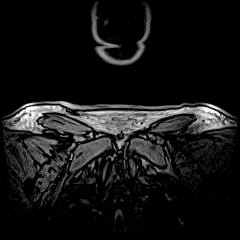
[im 79/96]
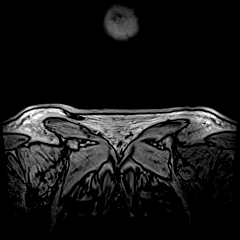
[im 84/96]
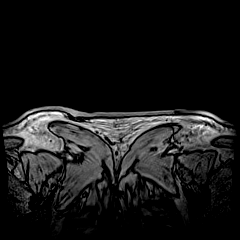
[im 90/96]
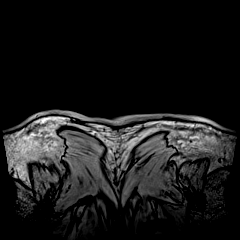
[im 96/96]
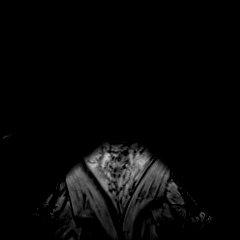

[Series 3: survey_mpr_sag · sagittal · 1.7mm · 1.67mm/px · 3 of 15 slices shown]
[im 1/15]
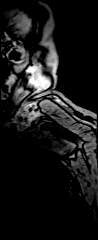
[im 8/15]
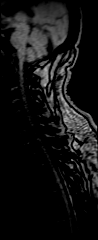
[im 15/15]
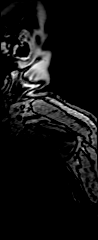

[Series 4: survey_mpr_(person_name) · axial · 1.7mm · 1.67mm/px · 1 of 7 slices shown]
[im 1/7]
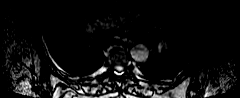

[Series 5: STIR · sagittal · 3.0mm · 0.86mm/px · 3 of 15 slices shown]
[im 1/15]
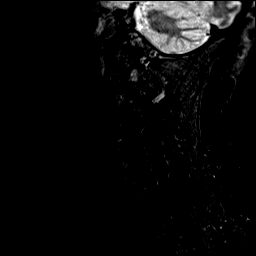
[im 8/15]
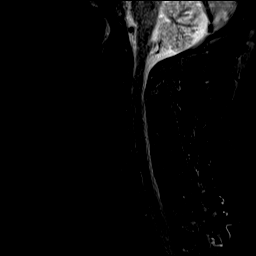
[im 15/15]
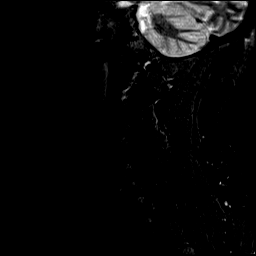

[Series 6: T2 · sagittal · 3.0mm · 0.57mm/px · 3 of 15 slices shown (1 of 2)]
[im 1/15]
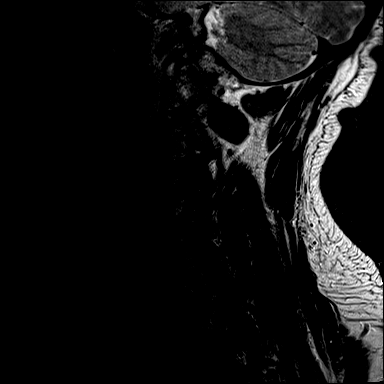
[im 8/15]
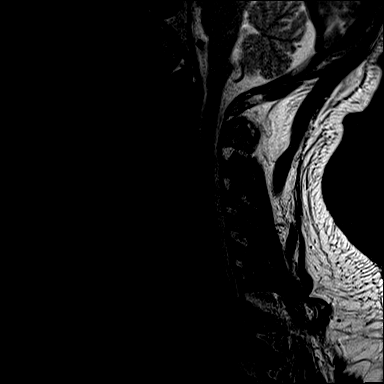
[im 15/15]
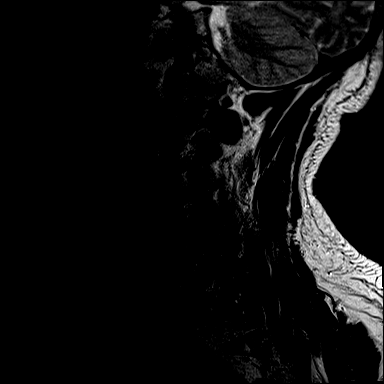

[Series 7: T1 · sagittal · 3.0mm · 0.69mm/px · 3 of 15 slices shown]
[im 1/15]
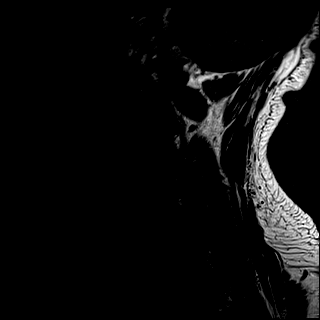
[im 8/15]
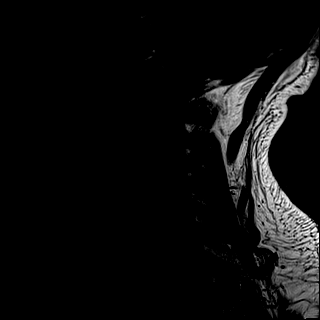
[im 15/15]
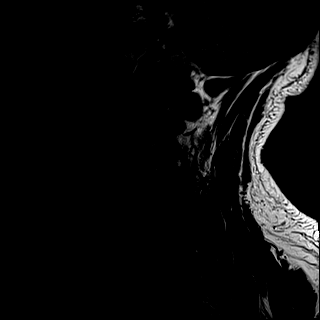

[Series 8: GRE · axial · 3.0mm · 0.35mm/px · z∈[-59,+42]mm · 6 of 33 slices shown]
[im 1/33]
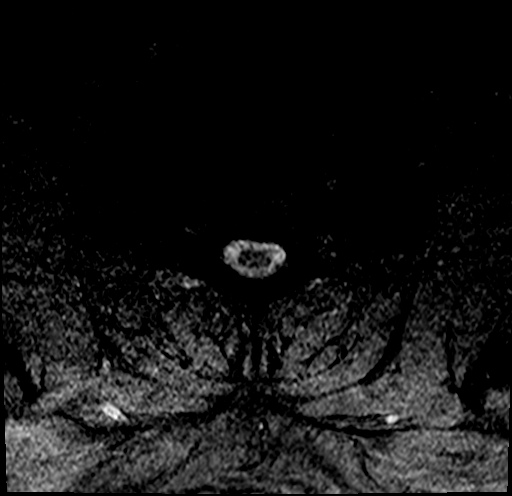
[im 7/33]
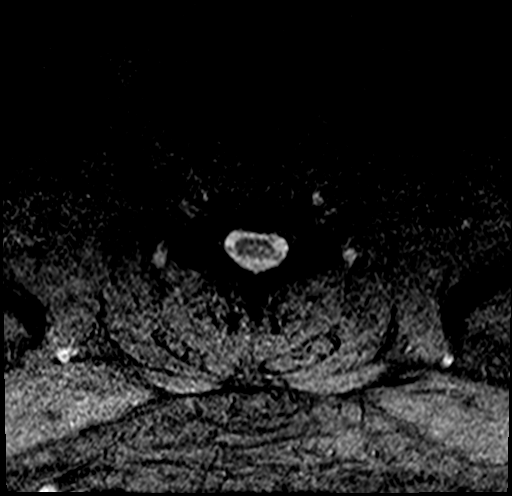
[im 13/33]
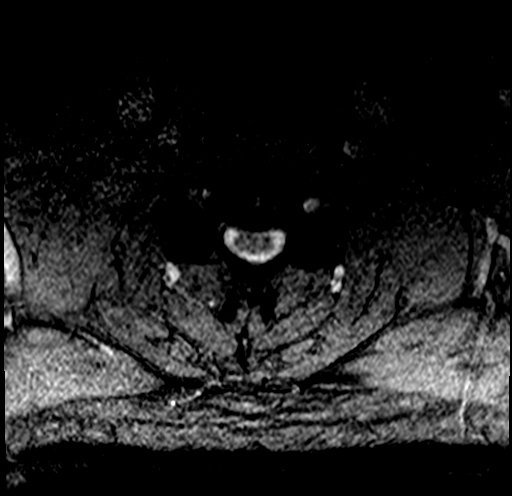
[im 20/33]
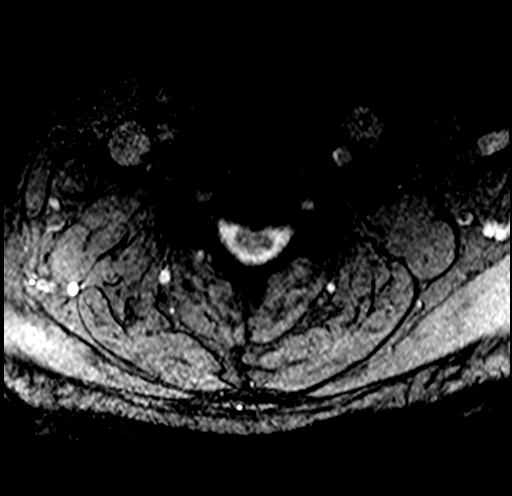
[im 26/33]
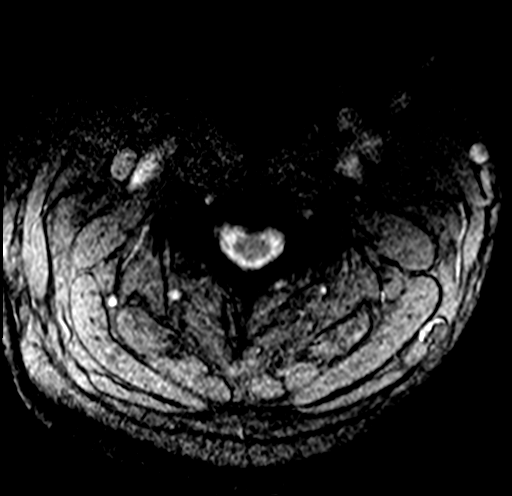
[im 33/33]
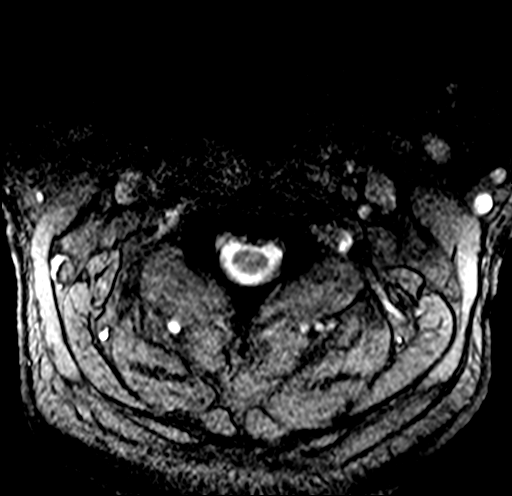

[Series 9: T2 · axial · 2.0mm · 0.70mm/px · z∈[-62,+44]mm · 11 of 56 slices shown (2 of 2)]
[im 1/56]
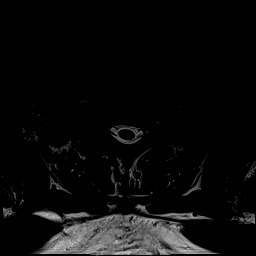
[im 6/56]
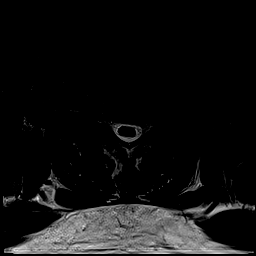
[im 12/56]
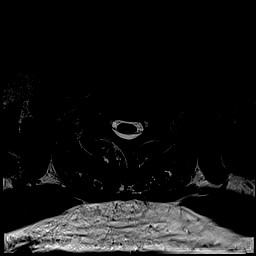
[im 17/56]
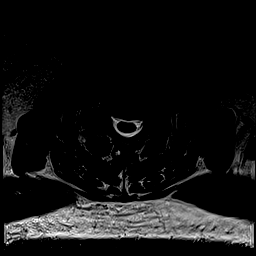
[im 23/56]
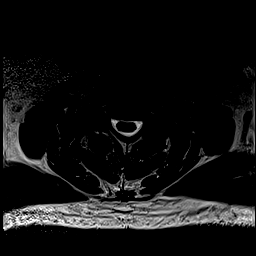
[im 28/56]
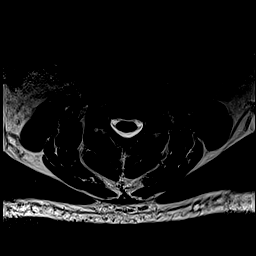
[im 34/56]
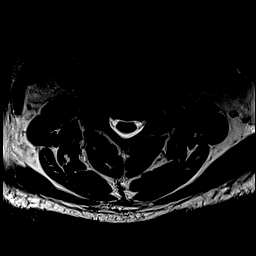
[im 39/56]
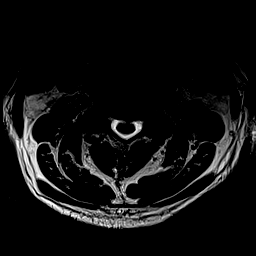
[im 45/56]
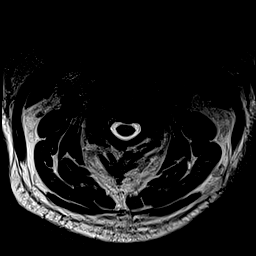
[im 50/56]
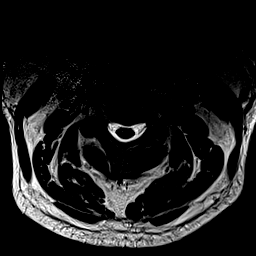
[im 56/56]
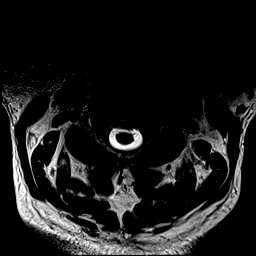

[48 of 48 positions shown; findings below may reference images not displayed]

FINDINGS: Sagittal images extend from C2 through T2. Axial images extend from C2 through T1
Straightening of the normal cervical lordosis.There is degenerative disc height loss at C4-C5, C5-C6, C6-C7. No Chiari malformation. No retropharyngeal soft tissue swelling. No cervical adenopathy. There is loss of flow void within the right intraforaminal vertebral artery (series 9 image 25). There are flow voids present within the carotid arteries. Vertebral bodies demonstrate normal height and marrow signal. The spinal cord shows no intrinsic signal abnormality. Visible posterior fossa structures are unremarkable, with no cerebellar tonsillar ectopia. Paraspinal soft tissues are unremarkable.
Findings at individual disc levels are as follows:
C2-3: No disc bulge. No neural foraminal or spinal canal stenosis. Mild facet arthropathy.
C3-C4: There is moderate bilateral neural foraminal stenosis. This is due to uncovertebral joint hypertrophy and facet arthropathy. No disc bulge. No spinal canal stenosis.
C4-C5: Mild to moderate bilateral neural foraminal stenosis. This is due to uncovertebral joint hypertrophy and facet arthropathy. Tiny disc bulge. No spinal canal stenosis.
C5-C6: There is moderate to severe bilateral neural foraminal stenosis. This is due to uncovertebral joint hypertrophy and facet arthropathy. Broad-based disc osteophyte complex. No spinal canal stenosis.
C6-C7: Mild bilateral neural foraminal stenosis. This is due to uncovertebral joint hypertrophy and facet arthropathy. No disc bulge. No spinal canal stenosis.
C7-T1: No neural foraminal or spinal canal stenosis. No disc bulge. No facet arthropathy.
IMPRESSION: Multilevel degenerative changes throughout the cervical spine most pronounced at C5-C6. There is moderate to severe bilateral neural foraminal stenosis secondary to disc height loss, uncovertebral joint hypertrophy, and facet arthropathy.
Loss of flow-void within the right intraforaminal vertebral artery. Recommend CTA of the neck for further evaluation.

## 2022-09-30 IMAGING — CT CT NECK ANGIO WITH AND WITHOUT IV CONTRAST
1 of 6 series · 4 of 14 positions shown · IV contrast (ISOVUE 370)
Comparison: MRI of the cervical spine 07/29/2022. CT pulmonary embolus protocol 10/14/2020. CT of the head 03/29/2013.

Follow up MRI.
FINAL REPORT:
INDICATION: Abnormal result of other cardiovascular function study abnormal right vertebral artery on MRI of the cervical spine.
Exam:
CTA of the head and neck with IV Contrast. 3D MIP Reformats were performed.
CT images were obtained from the aortic arch through the maxillary sinuses after the intravenous injection of nonionic contrast.  All CT scans at this facility use dose modulation and/or weight based dosing when appropriate to reduce radiation dose to as low as reasonably achievable. Study was erroneously labeled a CTA of the head and neck with and without contrast material. However, only postcontrast imaging was performed.

[Series 2: carotid thins · axial · 0.62mm/px · z∈[-51,+156]mm · 4 of 552 slices shown]
[im 111/552  soft-tissue]
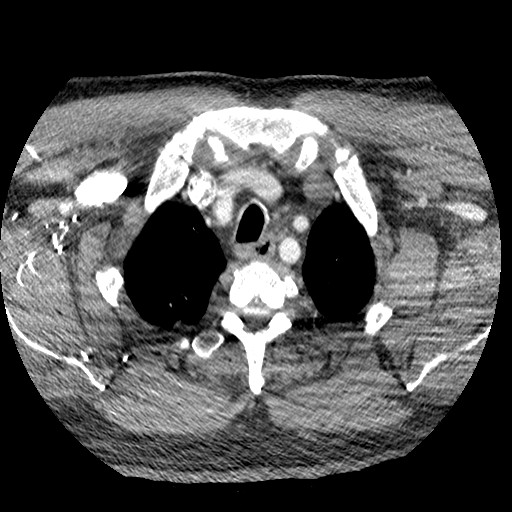
[im 221/552  bone]
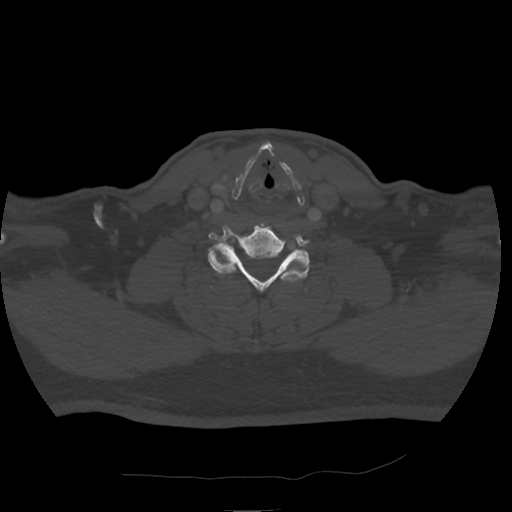
[im 331/552  soft-tissue]
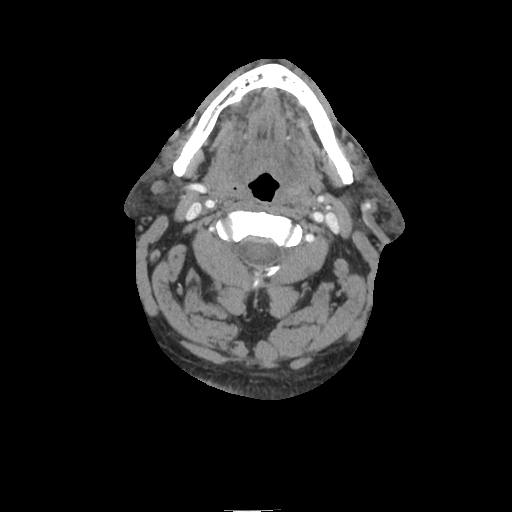
[im 441/552  bone]
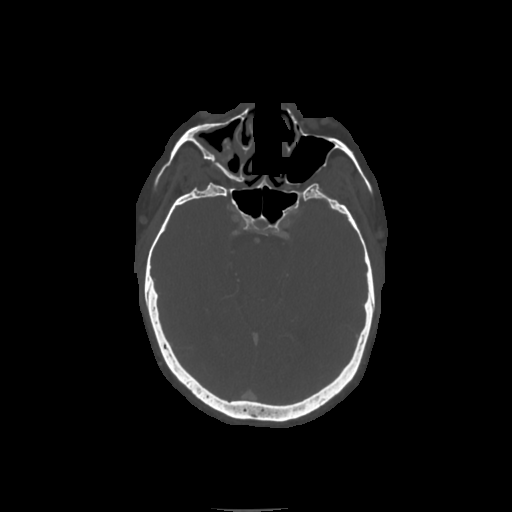

[4 of 14 positions shown; findings below may reference images not displayed]

FINDINGS: CTA Neck:
Aortic arch: No significant stenosis involving the origins of the right brachiocephalic artery, left common carotid artery, or left subclavian artery.
Innominate artery: Patent.
Right Subclavian Artery: Patent.
Right common carotid artery: Mild tortuosity. No hemodynamically significant stenosis or occlusion.
Right external carotid artery: Patent.
Right internal carotid artery: Mild calcified atherosclerotic plaque at the origin. No hemodynamically significant stenosis or occlusion.
Right vertebral artery: There is a congenitally hypoplastic right vertebral artery, which is partially obscured proximally due to motion degradation and venous contamination. The right vertebral artery is patent throughout the cervical course. At the origin of PICA, the right vertebral artery becomes markedly diminutive in size throughout the V4 segment to the vertebrobasilar junction. This pattern can be seen with anatomic variation of the congenitally hypoplastic vessel. As noted on the prior CT of the head from 03/29/2013, there is mild calcified atherosclerotic plaque in this region and a chronic high-grade stenosis at the origin of PICA could lead to a similar appearance and alteration of the flow void on the MRI of the cervical spine. However, these changes appear chronic, with chronic marked diminutive size of the right V4 segment dating back to 7410.
Left common carotid artery: Patent.
Left external carotid artery: Patent.
Left internal carotid artery: Patent.
Left vertebral artery: Dominant. Patent. The basilar artery and PCAs are patent.
Left subclavian artery: Patent.
The internal carotid arteries in the skull base to the carotid termini are patent. The visible ACAs and MCAs are patent.
Other findings: There is chronic enlargement of the main pulmonary arterial trunk, in keeping with longstanding pulmonary hypertension, noted on the prior CT pulmonary embolus protocol. There is moderate multilevel degenerative disc disease, which leads to mild multilevel spinal canal stenosis, and multilevel neural foraminal stenosis, better delineated on the MRI of the cervical spine.
The visible dural venous sinuses as well as the deep and cortical draining veins are patent.
There is thickening of the walls of the right maxillary sinus, with mucosal thickening and partially calcified soft tissue density in the partially opacified right maxillary sinus. This is in keeping with chronic sinusitis, with inspissated secretions and/or allergic fungal colonization.
All stenoses are based upon NASCET criteria.
IMPRESSION: 
IMPRESSION: 1. Congenitally hypoplastic right vertebral artery, which becomes markedly diminutive in size after the origin of PICA. This can be due to anatomic variation. Long segment stenosis, which would likely be chronic, in this distribution cannot be excluded, as detailed above. There is no evidence of infarction
2. Otherwise, unremarkable CTA of the neck.
3. Enlarged main pulmonary arterial trunk, in keeping with longstanding pulmonary hypertension.
4. Chronic right maxillary sinusitis

## 2022-11-01 MED ORDER — AMLODIPINE BESYLATE 10 MG PO TABS
10 MG | ORAL_TABLET | ORAL | 1 refills | Status: AC
Start: 2022-11-01 — End: 2023-07-01

## 2022-11-01 NOTE — Telephone Encounter (Signed)
Pt last seen on 03/08/22. Due to return in one year.    Has appt on 03/11/23.    Rx last filled on 04/14/22 #90/1RF.    Will forward to MD for refill.

## 2023-02-22 ENCOUNTER — Encounter

## 2023-02-22 MED ORDER — ROSUVASTATIN CALCIUM 5 MG PO TABS
5 MG | ORAL_TABLET | Freq: Every day | ORAL | 0 refills | Status: AC
Start: 2023-02-22 — End: 2023-03-14

## 2023-02-22 NOTE — Telephone Encounter (Signed)
Last office visit 06/11/2022  NEXT APPT  With Internal Medicine York Pellant, MD)  03/11/2023 at 8:00 AM    Last fill on crestor 07/12/22 # 90 with 1 additional refill

## 2023-03-11 ENCOUNTER — Ambulatory Visit
Admit: 2023-03-11 | Discharge: 2023-03-11 | Payer: BLUE CROSS/BLUE SHIELD | Attending: Student in an Organized Health Care Education/Training Program | Primary: Student in an Organized Health Care Education/Training Program

## 2023-03-11 DIAGNOSIS — Z0000 Encounter for general adult medical examination without abnormal findings: Secondary | ICD-10-CM

## 2023-03-11 NOTE — Assessment & Plan Note (Signed)
Controlled, cont amlodipine

## 2023-03-11 NOTE — Assessment & Plan Note (Signed)
Feeling more fatigued. Will update TSH and free T4.

## 2023-03-11 NOTE — Assessment & Plan Note (Signed)
Controlled, cont omeprazole

## 2023-03-11 NOTE — Progress Notes (Signed)
Jay Lee is a 62 y.o. year old male who presents today (03/11/23) for annual physical exam.    Assessment & Plan:   1. Routine general medical examination at a health care facility  Reviewed diet and exercise habits - he does well with this. Encouraged to avoid binge drinking on the weekends. Updated health maintenance, see below. Check screening labs.   -     CBC  -     Lipid Panel  -     Comprehensive Metabolic Panel  2. Essential (primary) hypertension  Assessment & Plan:  Controlled, cont amlodipine   3. Subclinical hypothyroidism  Assessment & Plan:  Feeling more fatigued. Will update TSH and free T4.    Orders:  -     TSH  -     T4, Free  4. Mixed hyperlipidemia  Assessment & Plan:  Update lipid panel to assess control. He is taking crestor now instead of fenofibrate.   5. Gastroesophageal reflux disease, unspecified whether esophagitis present  Assessment & Plan:  Controlled, cont omeprazole   6. Erectile dysfunction, unspecified erectile dysfunction type  I am a little concerned he has low testosterone based on his symptoms. Will check levels. Dx is complex - will refer to urology for further evaluation  -     AFL - Ramond Craver, MD, Urology, [facility])  -     Testosterone, free, total  7. PSA elevation  Sharp rate of rise last year. He did not get 57mo labs drawn. Will repeat PSA today.   -     PSA Screening  -     AFL - Ramond Craver, MD, Urology, [facility])  8. Prostate cancer screening  -     PSA Screening        Health Maintenance   Flu vaccine: declines booster  COVID vaccine: declines booster   RSV:   Tetanus vaccine: 02/2021  Shingles vaccine: declines vaccine   Pneumonia vaccine:   Colon cancer screening: 01/2020 q5 yr  PSA: 1.1 02/2021 --> 2.6 03/2022  Hep C: 03/2022  HIV: 03/2022  Lipid: 03/2022, will check today   DM:   Healthcare decision maker: wife  ACP:    RTC: 1 year annual or sooner PRN    Subjective:   Sethe was seen today for Annual Exam    Feels a little more achy  in shoulders and knee, had had procedures on all these joint.     HTN  - amlodipine 10mg    - hasn't been checking at home BP      HLD  - crestor      GERD  - omeprazole - works well      Subclinical hypothyroidism  - feels like he has no energy at all, very tired in there afternoon    Goes to bed around 9pm, sleeps hard for a couple of hours and then wakes up. Takes a couple hours to fall back asleep. Wakes up at 6:30. Has been going on for 6 months. No napping during the day. No caffeine. Alcohol on the weekends only. Has taken Nyquil and benedryl and doesn't think this is really helpful, still waking up in the middle of the night. He has stopped snoring since lifting HOB.     No spontaneous erections in quite some time. Having trouble maintaining erections for intercourse for the last couple of months.     Diet: banana and protein shake for breakfast. Chicken salad sandwich for lunch or may  skip. Will have fruit for dinner when it is very hot, pickle and mayo sandwich. Snacks on trail mix in the evenings. Doesn't feel hungry after being in the heat. In the winter he will have a personal pizza, hamburger or hot dog.     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 Prev rode the exercise bike and lifted weights in the evening but stopped 3 mo ago due to fatigue.    Review of Systems   All other systems reviewed and are negative.        PMHx    Patient Active Problem List   Diagnosis    Essential (primary) hypertension    Mixed hyperlipidemia    Subclinical hypothyroidism    Submandibular swelling    GERD (gastroesophageal reflux disease)       Prior to Admission medications    Medication Sig Start Date End Date Taking? Authorizing Provider   rosuvastatin (CRESTOR) 5 MG tablet TAKE 1 TABLET BY MOUTH EVERY DAY 02/22/23  Yes York Pellant, MD   amLODIPine (NORVASC) 10 MG tablet TAKE 1 TABLET BY MOUTH EVERY DAY 11/01/22  Yes York Pellant, MD   ibuprofen (ADVIL;MOTRIN) 600 MG tablet Take 1 tablet  by mouth 3 times daily as needed for Pain 07/09/22  Yes Paulene Floor, MD   NONFORMULARY VITAMIN A 2400 DAILY   Yes [provider]   omeprazole (PRILOSEC) 20 MG delayed release capsule Take 1 capsule by mouth daily 04/21/22  Yes Morrisa Aldaba, Prudence Davidson, MD   Multiple Vitamins-Minerals (CENTRUM SILVER 50+MEN PO) Centrum Silver   Yes [provider]   vitamin D (CHOLECALCIFEROL) 25 MCG (1000 UT) TABS tablet Take 1 tablet by mouth daily   Yes Automatic Reconciliation, Ar   cyanocobalamin 2000 MCG tablet Take by mouth daily   Yes Automatic Reconciliation, Ar   Flaxseed, Linseed, (FLAX SEED OIL) 1000 MG CAPS Take by mouth daily   Yes Automatic Reconciliation, Ar       The following sections were reviewed & updated as appropriate: Problem List, Allergies, PMH, PSH, FH, and SH.      Objective:   BP 136/79   Pulse 55   Temp 97.5 F (36.4 C) (Temporal)   Resp 16   Ht 1.706 m (5' 7.17")   Wt 85.8 kg (189 lb 3.2 oz)   SpO2 99%   BMI 29.49 kg/m     Physical Exam  Constitutional:       General: He is not in acute distress.  Eyes:      Extraocular Movements: Extraocular movements intact.   Cardiovascular:      Rate and Rhythm: Normal rate and regular rhythm.      Heart sounds: No murmur heard.  Pulmonary:      Effort: Pulmonary effort is normal. No respiratory distress.   Abdominal:      General: Bowel sounds are normal.      Palpations: Abdomen is soft.   Musculoskeletal:      Right lower leg: No edema.      Left lower leg: No edema.   Neurological:      General: No focal deficit present.      Mental Status: He is alert.   Psychiatric:         Mood and Affect: Mood normal.         Behavior: Behavior normal.              York Pellant, MD

## 2023-03-11 NOTE — Assessment & Plan Note (Signed)
Update lipid panel to assess control. He is taking crestor now instead of fenofibrate.

## 2023-03-13 LAB — CBC
Hematocrit: 46 % (ref 37.5–51.0)
Hemoglobin: 15.7 g/dL (ref 13.0–17.7)
MCH: 31.2 pg (ref 26.6–33.0)
MCHC: 34.1 g/dL (ref 31.5–35.7)
MCV: 92 fL (ref 79–97)
Platelets: 226 10*3/uL (ref 150–450)
RBC: 5.03 x10E6/uL (ref 4.14–5.80)
RDW: 13.1 % (ref 11.6–15.4)
WBC: 5.8 10*3/uL (ref 3.4–10.8)

## 2023-03-13 LAB — TESTOSTERONE, FREE, TOTAL
Testosterone, Free: 6.1 pg/mL — ABNORMAL LOW (ref 6.6–18.1)
Testosterone: 411 ng/dL (ref 264–916)

## 2023-03-13 LAB — COMPREHENSIVE METABOLIC PANEL
ALT: 19 IU/L (ref 0–44)
AST: 22 IU/L (ref 0–40)
Albumin: 4.8 g/dL (ref 3.9–4.9)
Alkaline Phosphatase: 71 IU/L (ref 44–121)
BUN/Creatinine Ratio: 21 (ref 10–24)
BUN: 17 mg/dL (ref 8–27)
CO2: 24 mmol/L (ref 20–29)
Calcium: 9.9 mg/dL (ref 8.6–10.2)
Chloride: 106 mmol/L (ref 96–106)
Creatinine: 0.81 mg/dL (ref 0.76–1.27)
Est, Glom Filt Rate: 99 mL/min/{1.73_m2} (ref >59)
Globulin, Total: 2.5 g/dL (ref 1.5–4.5)
Glucose: 98 mg/dL (ref 70–99)
Potassium: 5.1 mmol/L (ref 3.5–5.2)
Sodium: 143 mmol/L (ref 134–144)
Total Bilirubin: 0.4 mg/dL (ref 0.0–1.2)
Total Protein: 7.3 g/dL (ref 6.0–8.5)

## 2023-03-13 LAB — PSA SCREENING: PSA: 6 ng/mL — ABNORMAL HIGH (ref 0.0–4.0)

## 2023-03-13 LAB — TSH: TSH: 4.56 u[IU]/mL — ABNORMAL HIGH (ref 0.450–4.500)

## 2023-03-13 LAB — LIPID PANEL
Cholesterol, Total: 210 mg/dL — ABNORMAL HIGH (ref 100–199)
HDL: 48 mg/dL (ref >39)
LDL Cholesterol: 135 mg/dL — ABNORMAL HIGH (ref 0–99)
Triglycerides: 149 mg/dL (ref 0–149)
VLDL Cholesterol Calculated: 27 mg/dL (ref 5–40)

## 2023-03-13 LAB — T4, FREE: T4 Free: 1.03 ng/dL (ref 0.82–1.77)

## 2023-03-14 ENCOUNTER — Encounter

## 2023-03-14 MED ORDER — ROSUVASTATIN CALCIUM 10 MG PO TABS
10 | ORAL_TABLET | Freq: Every day | ORAL | 3 refills | Status: DC
Start: 2023-03-14 — End: 2024-03-07

## 2023-03-14 NOTE — Other (Signed)
Please call him to deliver lab results and ask about urology appt.    - did he make an appointment with urology? If so who and when and what office location - need to fax over his labs.   - Please inform him that his PSA is up to 6 which is a big jump from 2.6 last year. I would like for urology to evaluate if this is prostate enlargement, prostate irritation or infection, or prostate cancer.   - His testosterone levels are on the low end, will send this to urology to review as well.   - cholesterol is not responding to the 5mg  crestor, increase to 10mg  daily  - Normal blood counts, kidney function, liver function  - thyroid hormone level is the same has it has been for years, no worse. I do not feel that he needs thyroid hormone medication    I will also put all of this in a letter to mail to him.

## 2023-03-15 NOTE — Telephone Encounter (Signed)
Reason for call: Pt has appt with Dr Dory Peru, Urologist on [facility] on Aug 14 please send paperwork    Is this a new problem: Yes    Date of last appointment:  03/11/2023     Can we respond via MyChart: No    Best call back number:     Jay Lee, Jay Lee (EC) [phone] [facility])

## 2023-03-15 NOTE — Telephone Encounter (Signed)
Called  [facility] - spoke with Victorino Dike to get fax number for Dr Franco Collet.  [phone].  Faxed referral, lab results (PSA and Testosterone) and last office notes. Confirmation received.

## 2023-03-15 NOTE — Telephone Encounter (Signed)
Reason for call:  pt has appt Aug 14, with Urologist on [facility] Dr Dory Peru, please send paperwork     Is this a new problem: Yes    Date of last appointment:  03/11/2023     Can we respond via MyChart: No    Best call back number:     Jay, Lee (EC) [phone] [facility])

## 2023-04-08 MED ORDER — OMEPRAZOLE 20 MG PO CPDR
20 MG | ORAL_CAPSULE | Freq: Every day | ORAL | 0 refills | Status: DC
Start: 2023-04-08 — End: 2023-07-04

## 2023-04-08 NOTE — Telephone Encounter (Signed)
Rx sent to pharmacy as previously filled and verified by Verbal Order Read Back with provider.    NV 03/13/2023

## 2023-05-05 IMAGING — DX XR KNEE 2 VIEWS LEFT
1 series · 2 of 2 positions shown · non-contrast
Comparison: none

FINAL REPORT:
EXAM: 2 views left knee.
HISTORY: Left total knee replacement.

[Series 7013: AP · right · 0.10mm/px · 2 of 2 slices shown]
[im 1/2]
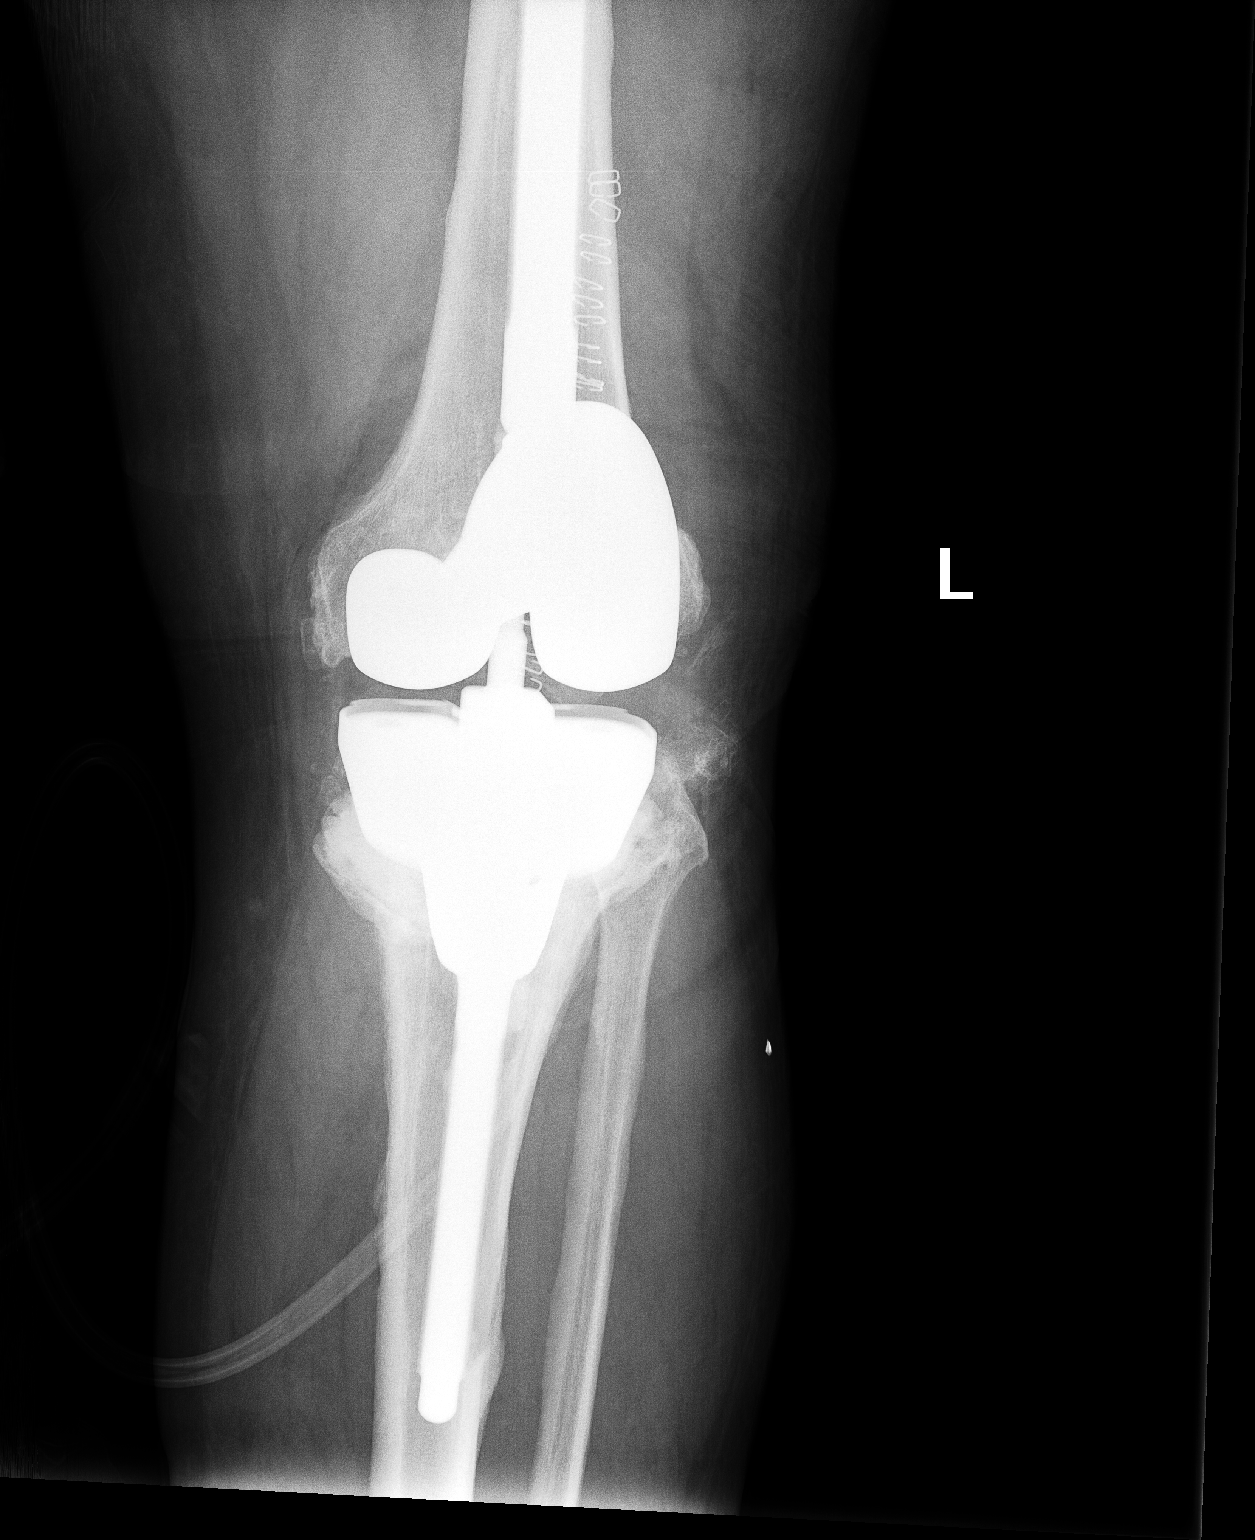
[im 2/2]
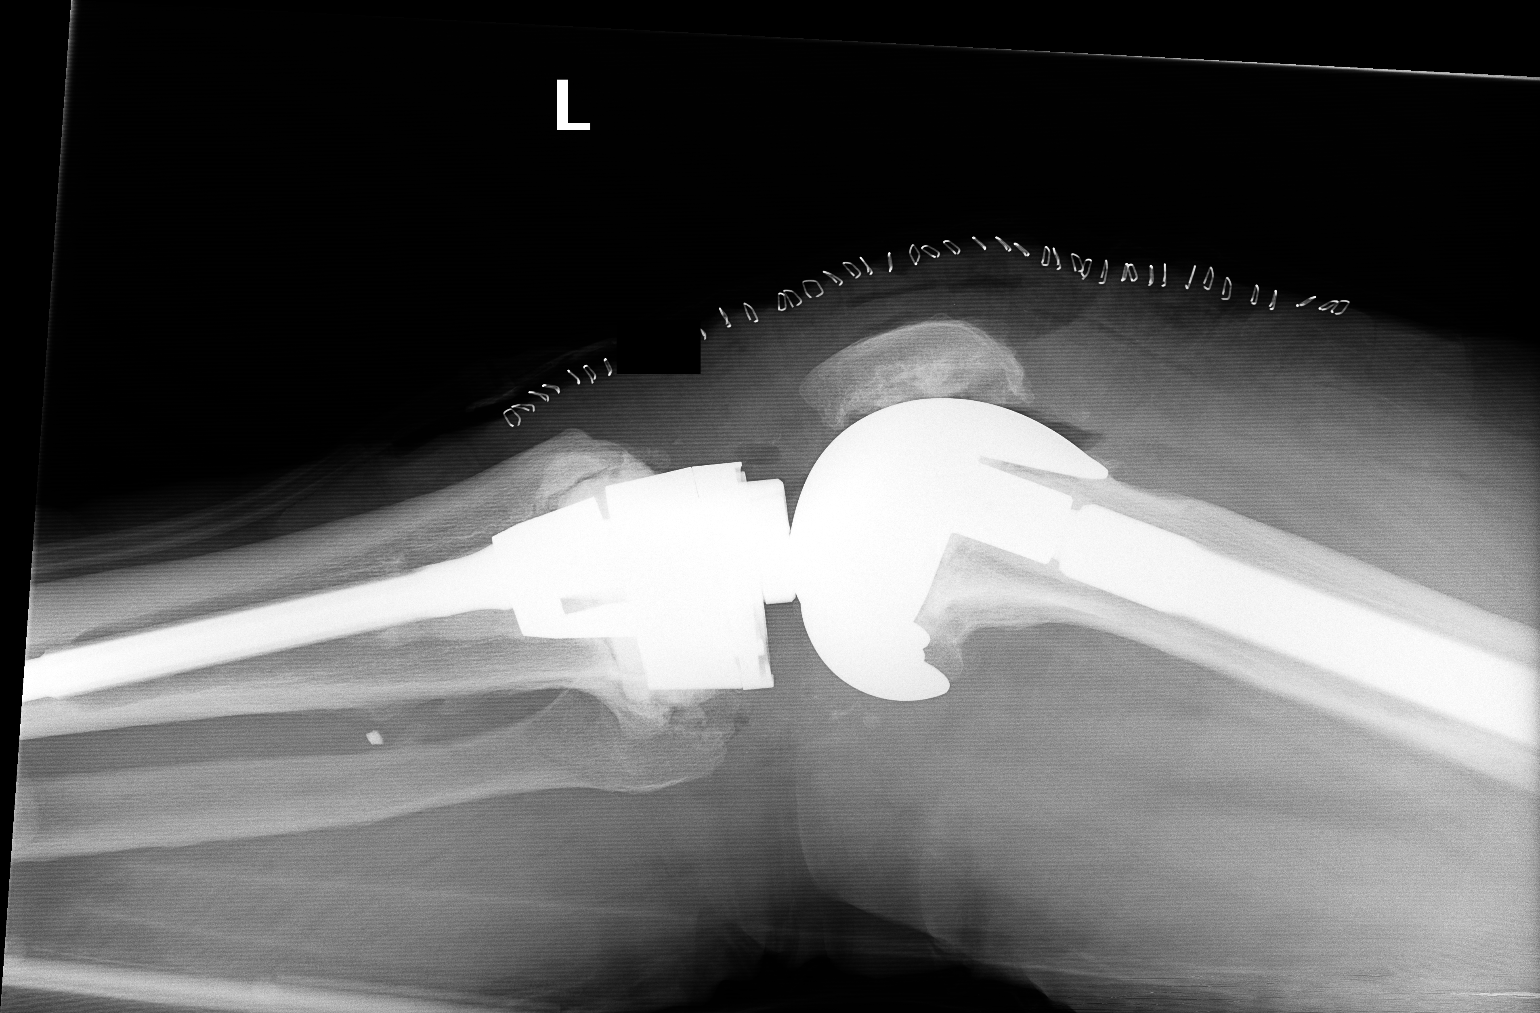

[2 of 2 positions shown; findings below may reference images not displayed]

FINDINGS: Skin staple line, soft tissue swelling, subcutaneous gas, gas in the joint space consistent with postsurgical changes. Linear lucency in the proximal tibia on both the AP and lateral projection may correlate with the cemented bone interface with possible small amount of gas. Otherwise, no evidence for potential acute fracture. No displacement. Osseous fragments are demonstrated.
IMPRESSION: Linear lucencies in the proximal tibia likely correlates with the cement-bone interface, possibly with small amount of trapped gas. 3 views left knee may be beneficial to further evaluate, exclude underlying fracture.

## 2023-07-01 MED ORDER — AMLODIPINE BESYLATE 10 MG PO TABS
10 | ORAL_TABLET | ORAL | 2 refills | Status: DC
Start: 2023-07-01 — End: 2024-03-20

## 2023-07-01 NOTE — Telephone Encounter (Signed)
Pt last seen on 03/11/23. Due to return in one year.    Has appt on 03/12/24.    Rx last filled on 11/01/22 #90/1RF.    Rx sent to pharmacy as #90/2RFand verified by Verbal Order Read Back with provider.

## 2023-07-04 MED ORDER — OMEPRAZOLE 20 MG PO CPDR
20 | ORAL_CAPSULE | Freq: Every day | ORAL | 2 refills | Status: DC
Start: 2023-07-04 — End: 2024-03-20

## 2023-07-04 NOTE — Telephone Encounter (Signed)
Pt last seen on 03/11/23. Due to return in one year.    Has appt on 03/12/24.    Rx last filled on 04/08/23 #90/0RF.    Rx sent to pharmacy as #90/2RF and verified by Verbal Order Read Back with provider.

## 2023-09-27 IMAGING — CT CT THORACIC SPINE WITHOUT CONTRAST
1 of 19 series · 1 of 19 positions shown, 2 images · non-contrast
Comparison: None
Axial spiral CT acquisition was performed from C6-L1 without contrast.

Mid thoracic pain- hx Diffuse idiopathic skeletal hyperostosis (DISH)
FINAL REPORT:
CT thoracic spine:
CLINICAL INDICATION: Collapsed vertebra, not elsewhere classified, thoracic region, initial encounter for fracture

[Series 7: lspine thins · axial · 0.53mm/px · z∈[-566,-566]mm · 1 of 462 slices shown, 2 images]
[im 1/462  soft-tissue]
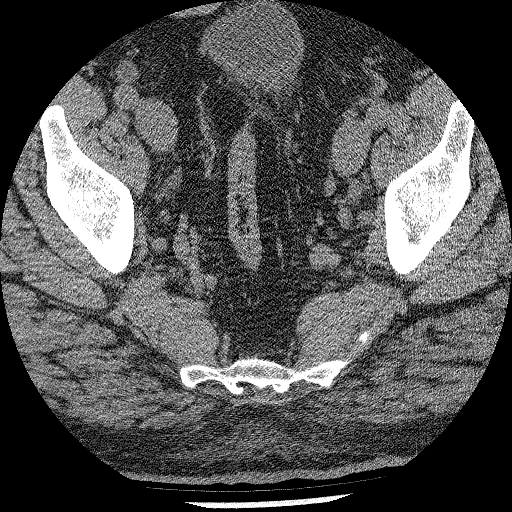
[im 1/462  bone]
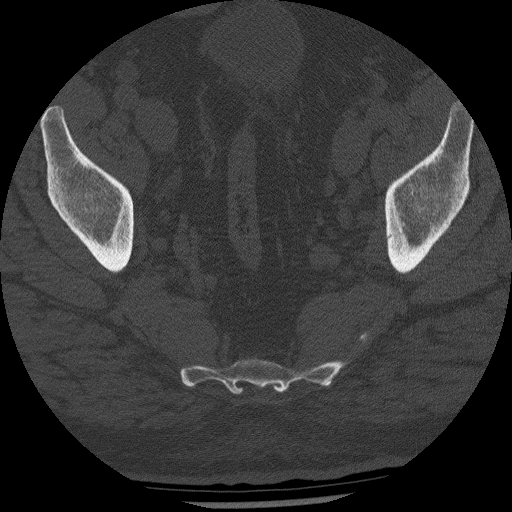

[1 of 19 positions shown; findings below may reference images not displayed]

All CT scans at this facility use iterative reconstruction technique, dose modulation and/or weight based dosing when appropriate to reduce radiation dose to as low as reasonably achievable.
There is previous T8 and T9 vertebroplasty/kyphoplasty. Thoracic vertebral body heights appear within normal limits. There are multilevel large anterior endplate osteophytes with multilevel anterior fusion consistent with DISH. There is no acute fracture or spondylolisthesis. No spinal or foraminal stenosis is appreciated. The paravertebral soft tissues appear unremarkable.
IMPRESSION: Previous T8 and T9 vertebroplasty/kyphoplasty.
DISH.
No acute fracture.

## 2023-12-22 ENCOUNTER — Inpatient Hospital Stay
Admit: 2023-12-22 | Payer: BLUE CROSS/BLUE SHIELD | Primary: Student in an Organized Health Care Education/Training Program

## 2023-12-22 NOTE — Consults (Signed)
 [facility]  [facility]    Radiation Oncology Consultation    Date of Visit: 12/22/23    Jay Lee  [id number]  02/16/1961     Care Team:  Dr. Adriane Albe    Diagnosis   C61    The patient is a 63 y.o. 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 32cc Gland.    AJCC Staging has been reviewed - overall stage IIIC  History of Present Illness   Jay Lee is a 63 y.o. male seen in consultation at the request of Dr. Adriane Albe to assess the role of radiation for his above diagnosis.    Oncologic History:  The patient has a past medical history of HTN, HLD, GERD  2023 - PSA 2.6 ng/mL (routine checked by his PCP)  02/2023 - PSA 6 ng/mL which prompted further workup  06/29/23 - MRI Prostate. 10mm PIRADS 4 lesion, right base PZ. 12mm PIRADS 4 lesion right apex PZ, posteior border with possible ECE. 8mm PIRADS 4 lesion left mid PZ. No apparent SV involvement.  10/06/2023 - Prostate biopsy. High volume high grade prostate cancer. 3 fusion biopsies with GG 5 disease (4+5). 6/12 routine cores with GG3 disease and 6/12 routine cores with GG 5 (4+5) disease. + PNI and cribriform pattern.  11/01/23: PSMA PET - focal PyL binding in right lateral PZ apex to base. Also binding in the right medial SV. No pelvic nodal disease or distant bony or visceral metastatic disease.  11/01/23 - T 309.4  11/28/23 - Lupron 1 mo given      12/30/23 seeing Adriane Albe again to finalize plans and for next ADT      Jay Lee presents today with his supportive wife to discuss radiotherapy treatment options for his prostate cancer.   On interview today, reports overall he is feeling OK. He received his first Lupron injection about 3 weeks ago.    Since, he reports he has noticed fatigue as well as hot flashes.  Denies hematochezia, melena, hematuria.  The last few years, he and his wife report that they have noticed  increased urinary frequency.  He does not take any urinary medications.  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   Jay Lee denies a history of inflammatory bowel disease, scleroderma or other collagen vascular diseases, pacemaker/AICD, or history of prior irradiation.    Review of Systems:  A complete review of systems was obtained and negative except as described above.    Allergies and Medications   No Known Allergies    Current Outpatient Medications   Medication Instructions    amLODIPine  (NORVASC ) 10 MG tablet TAKE 1 TABLET BY MOUTH EVERY DAY    cyanocobalamin 2000 MCG tablet Oral, DAILY    Flaxseed, Linseed, (FLAX SEED OIL) 1000 MG CAPS  Oral, DAILY    ibuprofen  (ADVIL ;MOTRIN ) 600 mg, Oral, 3 TIMES DAILY PRN    Multiple Vitamins-Minerals (CENTRUM SILVER 50+MEN PO) Centrum Silver    NONFORMULARY VITAMIN A 2400 DAILY     omeprazole  (PRILOSEC) 20 MG delayed release capsule Oral, DAILY    rosuvastatin  (CRESTOR ) 10 mg, Oral, DAILY    vitamin D (CHOLECALCIFEROL) 1,000 Units, Oral, DAILY      Past Medical/Surgical History     Past Medical History:   Diagnosis Date    GERD (gastroesophageal reflux disease)     High cholesterol     Hypertension     Prostate cancer (HCC)     Umbilical hernia without obstruction and without gangrene 07/09/2022     Past Surgical History:   Procedure Laterality Date    COLONOSCOPY  2020    ORTHOPEDIC SURGERY Right     FOREARM    ORTHOPEDIC SURGERY Right     ELBOW    ORTHOPEDIC SURGERY Left     shoulder  replacement    ORTHOPEDIC SURGERY Right     rotator cuff    SHOULDER SURGERY      TOTAL KNEE ARTHROPLASTY Right     UMBILICAL HERNIA REPAIR N/A 07/09/2022    OPEN UMBILICAL HERNIA REPAIR performed by Camilo Cella, MD at [facility] MAIN OR      Family and Social History     Family History   Problem Relation Age of Onset    Elevated Lipids Mother     Osteoarthritis Mother     Hypertension Mother     Diabetes Mother     Lung Disease Father     Heart Disease Brother     Osteoarthritis Brother     Colon Cancer Neg Hx     Prostate Cancer Neg Hx      Social History     Socioeconomic History    Marital status: Married     Spouse name: Not on file    Number of children: Not on file    Years of education: Not on file    Highest education level: Not on file   Occupational History    Not on file   Tobacco Use    Smoking status: Never    Smokeless tobacco: Never   Substance and Sexual Activity    Alcohol use: Yes     Comment: 2 days/week, 12 PACK over the weekend    Drug use: Never    Sexual activity: Not on file   Other Topics Concern    Not on file   Social History Narrative    Not on file     Social Drivers of Health     Financial Resource Strain: Low Risk  (03/11/2023)    Overall Financial Resource Strain (CARDIA)     Difficulty of Paying Living Expenses: Not hard at all   Food Insecurity: No Food Insecurity (03/11/2023)    Hunger Vital Sign     Worried About Running Out of Food in the Last Year: Never true     Ran Out of Food in the Last Year: Never true   Transportation Needs: Unknown (03/11/2023)    PRAPARE - Therapist, art (Medical): Not on file     Lack of Transportation (Non-Medical): No   Physical Activity: Not on file   Stress: Not on file   Social Connections: Not on file   Intimate Partner Violence: Not on file   Housing Stability: Unknown (03/11/2023)  Housing Stability Vital Sign     Unable to Pay for Housing in the Last Year: Not on file     Number of Places Lived in the Last Year: Not on file      Unstable Housing in the Last Year: No     Physical Exam:   Vitals: Blood pressure (!) 144/84, pulse 82, resp. rate 16, height 1.727 m (5\' 8" ), weight 88.5 kg (195 lb).   Performance Status: ECOG 0, fully active, able to carry on all predisease activities without restrictions  Constitutional: Pleasant, sitting comfortably in no acute distress.   HEENT: Moist mucous membranes.  Cardiac: Good peripheral perfusion, no upper or lower extremity edema bilaterally.  Pulmonary: No increased work of breathing. Symmetric chest rise  Skin: Warm, dry, no rashes noted.  Neurologic: Alert and oriented.  Psychiatric: Cooperative to commands and responds to questions appropriately.    Imaging & Pathology   Imaging reports were reviewed as detailed in his history above    Prostate Biopsy: 10/06/2023        Assessment   Jay Lee is a 63 y.o. 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 32cc Gland.      Treatment Intent: Curative     Counseling   The natural history of prostate cancer was reviewed with Jay Lee in detail. Prognostic factors including gleason score, iPSA, PSA doubling time, and clinical TNM stage were reviewed with the patient. Standard treatment modalities such as surgery, radiotherapy, and ADT per the current NCCN guidelines were discussed with the patient. I emphasized that I would not recommend active surveillance at this point given his life expectancy and histopathologic/clinical risk factors.    Per the [facility] prostate cancer nomogram, I discussed that with the known clinical/pathologic features of his disease, his 15 yr prostate cancer specific survival is 56%. The chance of organ confined disease is 2%, risk of ECE is 98%, SVI risk is 78%, and chance of lymph node involvement is 72%. Discussed that he has high volume very aggressive localized prostate cancer. Treatment is warranted.    Given he has very high  risk disease, I discussed that long course ADT (31mo -43yr) is standard of care and is recommended for him. We discussed GU009 which is utilizing DECIPHER genetic testing to tailor ADT. He is interested in this study and I told him the research team will be reaching out to him to discuss this further. I have also ordered DECIPHER testing for him to be completed.    Also, given he has very high risk disease, I would also recommend consideration of an ARPI in addition to AD, as this demonstrated an OS benefit for high risk patients on the StAMPEDE trial. Will get him set up with [facility] at [facility] to discuss further and for monitoring of labs during therapy.    I discussed the following options:  -- External beam radiotherapy (either SBRT or moderate hypofractionation) w/long course ADT (+/- brachytherapy boost)  -- Surgical resection with prostatectomy (if offered by Urology) - with adjuvant therapies as indicated based on final pathology    I also discussed the oncologic outcomes whether he would proceed with surgical vs radiotherapy based treatment are equivalent. I did discuss with surgical approaches, that there is a potential risk of requiring RT and/or ADT based on post operative pathologic features which he understands.     If treating with SBRT, I discussed that  I generally favor SpaceOAR and fiducial placement prior to CT SIM for RT planning, however in certain circumstances of favorable anatomy this can be omitted.    Given high volume disease and high grade group disease, as well as his young and and overall good health, I would favor a combination approach with ADT + EBRT with brachytherapy boost as this has been shown to have better outcomes compared to EBRT alone + ADT (AscendeRT, 15-year b-PFS 80% [LDR-PB] vs 53% [DE-EBRT]). We discussed that this approach has slightly higher acute and late GU toxicity profile and comparable GI toxicity (both acute/late) when compared to EBRT alone approaches. In  anticipation of all of this, I would like to have him set up to meet with one of my colleagues who performs LDR brachytherapy implants to further discuss this option.     I discussed the logistics, benefits, and risks (side effect profile) of radiation therapy to the prostate which include but are not limited to fatigue, inflammation to the bladder/urethra causing urinary frequency, urgency, and/or burning, inflammation to the rectum/bowel causing diarrhea/loose stools, abdominal cramping/bloating, nausea/vomiting, anorexia, skin reactions (erythema, itching, dryness, possible desquamation), hair loss, urinary retention, reductions in blood count. Long-term effects include persistent skin changes, urinary symptoms, changes in sperm count, sexual dysfunction, persistent hair loss, delayed wound healing, rectal/bladder bleeding, scarring to the urinary/GI tract, lymphedema, fistula, and secondary malignancy.    After a long discussion with the patient, he would like to proceed with definitive treatment. Leaning toward combo EBRT + LDR brachytherapy boost with ADT.   Interested in GU009 - research team to reach out to him    Plan   -- Proceed with Lupon 6 mo injection with Dr. Adriane Albe and labs 12/30/23  -- Also placed order for ARPI  -- Will have him set up to discuss LDR seed implantation with Dr. Clary Crown or Dr. Rosebud Confer  -- DECIPHER testing ordered and the research team will reach out to him about GU009 study in the coming week  -- Is leaning towards staying on ADT until after his trip to [place]  (will return 03/03/24) which I told him is not unreasonable      Upcoming trips:  [facility] 10-17  May 23-26 [facility] weekennd  June 28 - July 5th 2025    He prefers to have his treatment at [facility]  --  Thank you for the opportunity to participate in the care of Jay Lee. Please feel free to contact me with any questions or concerns.    Herma Longest, DO  [facility]  [facility]   [address] [place], [place], [place] [address]  P: [phone]  [facility]  [address], [place] [address]  P: [phone]  [facility]  [address], [place], [place] [address]  P: [phone]    Time and Counseling: I spent a total of 68 minutes in care of this patient during today's encounter on 12/21/23.    Carbon Copy:  Rosalyn Combes, MD

## 2023-12-26 ENCOUNTER — Inpatient Hospital Stay
Admit: 2023-12-26 | Payer: BLUE CROSS/BLUE SHIELD | Attending: Radiation Oncology | Primary: Student in an Organized Health Care Education/Training Program

## 2023-12-26 VITALS — BP 174/83 | HR 55 | Ht 68.0 in | Wt 195.0 lb

## 2023-12-26 DIAGNOSIS — C61 Malignant neoplasm of prostate: Secondary | ICD-10-CM

## 2023-12-26 NOTE — Consults (Signed)
 RADIATION ONCOLOGY OFFICE NOTE    Patient Name: Jay Lee  Patient Date of Birth: 1961-01-01   Medical Record Number: [id number]  Referring Physician: Rosalyn Combes, MD  [address]  [address]  [place],  [place] [address]  Primary Care Provider: Saundra Curl, MD    DIAGNOSIS & STAGING: Cancer Staging   Prostate cancer [facility])  Staging form: Prostate, AJCC 8th Edition  - Clinical stage from 11/01/2023: Stage IIIC (cT3b, cN0, cM0, PSA: 6, Grade Group: 5) - Unsigned      ICD-10-CM    1. Prostate cancer [facility])  C61         AJCC Staging has been reviewed      CHIEF COMPLAINT: The patient is a 63 y.o. 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 32cc Gland.     RADIATION HISTORY:  none    RETURN VISITS:    12/26/23: Jay Lee comes in to discuss brachytherapy boost. He started ADT 11/28/23 with a 1 month Lupron, but the next is not scheduled. He sees Dr Adriane Albe 12/30/23.  The plan is for combination brachytherapy.  I reviewed the MRI 06/29/2023, and a PSMA PET/CT 11/01/2023, and agree with the radiology report for both scans.  We discussed that I agree with the plan for combination brachytherapy.  Agree with the addition of an ARPI to 2 years ADT.  I think he is likely eligible for LDR brachytherapy.  Based on the seminal vesicle invasion present on the MRI and PET/CT, I think he should probably be considered for HDR brachytherapy, which likely has some dosimetric advantages in that scenario.  The patient and his wife plan on some upcoming travel.  They state the plan is for CT simulation in late June prior to their trip to [place] .  Radiation to start in early July on the return.  I think this is reasonable as the prostate cancer should be temporized by ADT in the interim.  I discussed this patient with Dr. Samella Crete on the date of service.      HISTORY OF PRESENT ILLNESS AT CONSULT with Dr Samella Crete:      Jay. Lee is a 63 y.o. male seen in  consultation at the request of Dr. Adriane Albe to assess the role of radiation for his above diagnosis.     Oncologic History:  The patient has a past medical history of HTN, HLD, GERD  2023 - PSA 2.6 ng/mL (routine checked by his PCP)  02/2023 - PSA 6 ng/mL which prompted further workup  06/29/23 - MRI Prostate. 10mm PIRADS 4 lesion, right base PZ. 12mm PIRADS 4 lesion right apex PZ, posteior border with possible ECE. 8mm PIRADS 4 lesion left mid PZ. No apparent SV involvement.  10/06/2023 - Prostate biopsy. High volume high grade prostate cancer. 3 fusion biopsies with GG 5 disease (4+5). 6/12 routine cores with GG3 disease and 6/12 routine cores with GG 5 (4+5) disease. + PNI and cribriform pattern.  11/01/23: PSMA PET - focal PyL binding in right lateral PZ apex to base. Also binding in the right medial SV. No pelvic nodal disease or distant bony or visceral metastatic disease.  11/01/23 - T 309.4  11/28/23 - Lupron 1 mo given        12/30/23 seeing Adriane Albe again to finalize plans and for next ADT        Jay. Lee presents today with his supportive wife to discuss radiotherapy treatment  options for his prostate cancer.   On interview today, reports overall he is feeling OK. He received his first Lupron injection about 3 weeks ago.    Since, he reports he has noticed fatigue as well as hot flashes.  Denies hematochezia, melena, hematuria.  The last few years, he and his wife report that they have noticed increased urinary frequency.  He does not take any urinary medications.  No recent appetite changes or weight loss        IPSS 7 (not on medications)  QOL 5  SHIM 13     Last screening colonoscopy was within the last 2 years, he was recommended a 5-year repeat and thus will be due in approximately 2028       PHYSICAL EXAM:     Vital Signs for this encounter:  BSA: 2.06 meters squared  BP (!) 174/83   Pulse 55   Ht 1.727 m (5\' 8" )   Wt 88.5 kg (195 lb)   BMI 29.65 kg/m   Performance status: ECOG 0, fully active, able to carry  on all predisease activities without restrictions  Constitutional: No evidence of impaired alertness, uncooperativeness, developmental delays, altered mood or affect, or disorientation.  Head: Normocephalic, atraumatic  Eyes: No evidence of conjunctivitis or scleral abnormalities.  Skin: No evidence of blistering or rash.  Cardiovascular: No evidence of abnormal heart rate.  Respiratory: Breathing unlabored on room air without accessory muscle use.  Musculoskeletal: Normal muscle bulk. No deformity.  Neurologic: Grossly intact. Normal gait.  Psychiatric: Appropriate mood and affect. Good judgment and insight.  Hematologic/Lymphatic: No evidence of petechiae / purpura / ecchymosis.        Impression    The patient is a 63 y.o. 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 32cc Gland.     For men with Gleason 9 disease I favor combination brachytherapy plus external beam radiation, with androgen deprivation therapy, based partially on the Kishan et al (JAMA 2018) multi-institutional pooled series showing improved prostate cancer-specific survival, distant metastasis-free survival, and overall survival for combination brachytherapy patients compared to patients not receiving brachytherapy boost, or those receiving surgery. While I think LDR brachytherapy is potentially feasible in this case, based on the seminal vesicle invasion seen on his MRI and the PET/CT, I think it is worth considering HDR brachytherapy boost.  I will refer to [facility] for this discussion.  While HDR brachytherapy could be performed before or after IMRT, I suspect based on the seminal vesicle invasion that it might be optimal to wait until after IMRT in this case.        Plan:  -I messaged Dr. Lazaro Prime scheduler to see if we can get a 39-month Lupron arranged for the visit already scheduled for 12/30/2023. He is due.   -Agree with the addition of ARPI  -Refer to  [facility] for consideration of HDR brachytherapy boost.  While I think he could potentially have LDR brachytherapy boost, seminal vesicle invasion argues for consideration of HDR brachytherapy in my opinion.  -I think the plan for CT simulation in late June, with radiation to start after their trip to [place]  is quite reasonable.  I expect his prostate cancer to be adequately temporized with ADT over that interval.    -The patient and his wife were encouraged to call with any questions or concerns.      TIME and COUNSELING:  I spent a total of 47 minutes  in care of this patient on the date of service.         PAST MEDICAL HISTORY:  Past Medical History:   Diagnosis Date    GERD (gastroesophageal reflux disease)     High cholesterol     Hypertension     Prostate cancer (HCC)     Umbilical hernia without obstruction and without gangrene 07/09/2022         PAST SURGICAL HISTORY:  Past Surgical History:   Procedure Laterality Date    COLONOSCOPY  2021    ORTHOPEDIC SURGERY Right     FOREARM    ORTHOPEDIC SURGERY Right     ELBOW    ORTHOPEDIC SURGERY Left     shoulder replacement    ORTHOPEDIC SURGERY Right     rotator cuff    SHOULDER SURGERY      TOTAL KNEE ARTHROPLASTY Right     UMBILICAL HERNIA REPAIR N/A 07/09/2022    OPEN UMBILICAL HERNIA REPAIR performed by Camilo Cella, MD at [facility] MAIN OR         FAMILY HISTORY:   Family History   Problem Relation Age of Onset    Elevated Lipids Mother     Osteoarthritis Mother     Hypertension Mother     Diabetes Mother     Lung Disease Father     Heart Disease Brother     Osteoarthritis Brother     Colon Cancer Neg Hx     Prostate Cancer Neg Hx        SOCIAL HISTORY:   Social History     Occupational History    Not on file   Tobacco Use    Smoking status: Never    Smokeless tobacco: Never   Substance and Sexual Activity    Alcohol use: Yes     Alcohol/week: 3.0 standard drinks of alcohol     Types: 3 Cans of beer per week     Comment: 2 days/week, 12 PACK over the weekend    Drug use:  Never    Sexual activity: Not Currently     Partners: Female       ALLERGIES/MEDICATIONS:  No Known Allergies  Current Outpatient Medications   Medication Sig Dispense Refill    omeprazole  (PRILOSEC) 20 MG delayed release capsule TAKE 1 CAPSULE BY MOUTH EVERY DAY 90 capsule 2    amLODIPine  (NORVASC ) 10 MG tablet TAKE 1 TABLET BY MOUTH EVERY DAY 90 tablet 2    rosuvastatin  (CRESTOR ) 10 MG tablet Take 1 tablet by mouth daily 90 tablet 3    ibuprofen  (ADVIL ;MOTRIN ) 600 MG tablet Take 1 tablet by mouth 3 times daily as needed for Pain 30 tablet 0    NONFORMULARY VITAMIN A 2400 DAILY      Multiple Vitamins-Minerals (CENTRUM SILVER 50+MEN PO) Centrum Silver      vitamin D (CHOLECALCIFEROL) 25 MCG (1000 UT) TABS tablet Take 1 tablet by mouth daily      cyanocobalamin 2000 MCG tablet Take by mouth daily      Flaxseed, Linseed, (FLAX SEED OIL) 1000 MG CAPS Take by mouth daily       No current facility-administered medications for this encounter.

## 2023-12-30 NOTE — Progress Notes (Signed)
 NCCN Distress Thermometer    Radiation Oncology at [facility]    Date Screening Completed: 12/22/23    Screening Declined:  []  Yes    Number that best describes how much distress you've experienced in the past week, including today?  0 []  - No distress 1 []       2 []       3 []       4 []        5 [x]        6 []       7 []       8 []       9 []        10 []  - Extreme distress    PROBLEM LIST  Have you had concerns about any of the items below in the past week, including today?      Physical Concerns Practical Concerns   []  Pain []  Taking care of myself    [x]  Sleep []  Taking care of others    [x]  Fatigue []  Safety   []  Tobacco use  [x]  Work   []  Substance use  []  School   []  Memory or concentration []  Housing/Utilities   []  Sexual health []  Finances   []  Changes in eating  []  Insurance   []  Loss or change of physical abilities  []  Transportation    []  Child care   Emotional Concerns []  Having enough food   []  Worry or anxiety []  Access to medicine   []  Sadness or depression []  Treatment decisions   []  Loss of interest or enjoyment     []  Grief or loss  Spiritual or Religious Concerns   []  Fear []  Sense of meaning or purpose   []  Loneliness  []  Changes in faith or beliefs   []  Anger []  Death, dying, or afterlife   []  Changes in appearance []  Conflict between beliefs and cancer treatments    []  Feelings of worthlessness or being a burden []  Relationship with the sacred    []  Ritual or dietary needs    Social Concerns     []  Relationship with spouse or partner     []  Relationship with children    []  Relationship with family members     []  Relationship with friends or coworkers     Administrator, sports with health care team     []  Ability to have children     []  Prejudice or discrimination        Other Concerns: Pool

## 2023-12-30 NOTE — Telephone Encounter (Signed)
 [employer]  Oncology Social Work Encounter    Location: Radiation Oncology at [facility]  Patient: Jay Lee (April 03, 1961)    Encounter Type: Distress/PHQ Screening      Concerns/Barriers to Care: none    Narrative: Called patient to introduce social work role and support resources. Patient noted he is well supported with practical and emotional needs. He denies any barriers to care or needs for supportive resources at this time. 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 Patient reports he is actively coping with diagnosis. He has a trip planned to [place]  and [place]  with his wife.    Reviewed support resources with patient including [facility] and support groups. Provided social worker contact information and encouraged patient to contact social worker as needed for ongoing psychosocial support, patient voiced understanding.           Information/Education/Referrals Provided:    Complimentary/Integrative Therapies  Support Groups/Peer Support     Plan: Follow up as needed    Missy Santillan, LMSW  Social Work, Radiation Oncology  [facility] [employer]  Direct #: [phone] F: [phone]

## 2024-02-03 LAB — PSA, EXTERNAL: PSA, External: 0.725

## 2024-02-20 ENCOUNTER — Ambulatory Visit: Payer: BLUE CROSS/BLUE SHIELD | Primary: Student in an Organized Health Care Education/Training Program

## 2024-03-07 ENCOUNTER — Encounter

## 2024-03-07 MED ORDER — ROSUVASTATIN CALCIUM 10 MG PO TABS
10 | ORAL_TABLET | Freq: Every day | ORAL | 0 refills | 30.00000 days | Status: DC
Start: 2024-03-07 — End: 2024-03-25

## 2024-03-07 NOTE — Telephone Encounter (Signed)
 Pt last seen on 03/11/2023. Due to return in one year.    Has appt on 03/12/2024.    Rx last filled on 03/14/23 #90/3RF.    Rx sent to pharmacy as #30/1RF and verified by Verbal Order Read Back with provider.

## 2024-03-12 ENCOUNTER — Encounter

## 2024-03-12 ENCOUNTER — Ambulatory Visit
Admit: 2024-03-12 | Discharge: 2024-03-12 | Payer: BLUE CROSS/BLUE SHIELD | Attending: Student in an Organized Health Care Education/Training Program | Primary: Student in an Organized Health Care Education/Training Program

## 2024-03-12 VITALS — BP 130/70 | HR 83 | Temp 97.80000°F | Resp 16 | Ht 68.0 in | Wt 203.6 lb

## 2024-03-12 DIAGNOSIS — Z0000 Encounter for general adult medical examination without abnormal findings: Principal | ICD-10-CM

## 2024-03-12 NOTE — Assessment & Plan Note (Signed)
 He is receiving treatment for prostate cancer. He is followed by urology Dr Kara and radiology oncology at [facility]

## 2024-03-12 NOTE — Progress Notes (Signed)
 Jay Lee is a 63 y.o. year old male who presents today (03/12/24) for annual physical exam.    Assessment & Plan:   1. Routine general medical examination at a health care facility  Reviewed diet and exercise habits - he does well with diet. 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 We discussed the need to do physical activity particularly because he is on androgen deprivation therapy and prednisone which causes muscle and bone loss. Updated health maintenance, see below. Check screening labs. CBC and CMP checked every 3 mo by Jay Lee - will request copies of latest labs  -     Hemoglobin A1C; Future  2. Prostate cancer [facility])  Assessment & Plan:  He is receiving treatment for prostate cancer. He is followed by urology Jay Lee and radiology oncology at [facility]   3. On prednisone therapy  Part of his prostate cancer treatment regimen  Check A1c  4. Essential (primary) hypertension  Assessment & Plan:  Controlled, cont amlodipine    5. Mixed hyperlipidemia  Assessment & Plan:  Taking crestor  10mg  daily  Update lipid panel to assess control   Orders:  -     Lipid Panel; Future  6. Gastroesophageal reflux disease, unspecified whether esophagitis present  Assessment & Plan:  Controlled, cont omeprazole    7. Subclinical hypothyroidism  Assessment & Plan:  Will update TSH and free T4.    Fatigue due to medically induced low T  Orders:  -     T4, Free; Future  -     TSH; Future      Health Maintenance   Flu vaccine: declines booster  COVID vaccine: declines booster   RSV:   Tetanus vaccine: 02/2021  Shingles vaccine: declines vaccine   Pneumonia vaccine:   Colon cancer screening: 01/2020 q5 yr  PSA: per urology   Hep C: 03/2022  HIV: 03/2022  Lipid: 02/2023, will check today   DM:   Healthcare decision maker: wife  ACP:    RTC: 1 yr CPE vs welcome to medicare     Subjective:   Jay Lee was seen today for Annual Exam    History of Present Illness  The patient presents for a physical  exam.    He was diagnosed with prostate cancer and is under the care of Jay. Kara, a urologist, and a radiation oncologist. He underwent a procedure on 03/09/2024 and is currently on Lupron therapy. He has been taking prednisone twice daily for several months, prescribed by Jay. Kara.     He continues to take amlodipine  for blood pressure management but does not monitor his blood pressure at home. He also takes rosuvastatin  10 mg and omeprazole  for acid reflux, which he finds effective.    His daily diet includes a banana and protein shake for breakfast, a BLT or chicken salad sandwich for lunch, and a variety of different meals for dinner. Dinner is cooked at home sometimes and often including chicken or [place] meatballs, salad.  His fluid intake consists of water , Gatorade, and occasionally Diet [name].   He has not been exercising recently due to lack of motivation and advice to rest for 2 weeks after brachytherapy 03/10/24. However, his job as a Gaffer involves a lot of walking.     His alcohol consumption has decreased, with only one beer consumed over the past weekend. Prev drank 12 pack of beer over the weekend.     Social History:  Alcohol: Decreased consumption, with only one beer over the past weekend.  Tobacco: No use of nicotine or tobacco products.  Recreational Drugs: No use of recreational drugs such as marijuana    FAMILY HISTORY  His mother has a slight case of diabetes.    Prostate Ca  - Jay Lee  - rad onc at [name] - brachytherapy  - lupron, zytiga  - 03/10/24 started on flomax and ppx cipro x3 d    HTN  - amlodipine   - home BP - not checking    HLD  - crestor  10mg  (increased last year)     GERD  - omeprazole     Subclinical hypothyroidism     Review of Systems   All other systems reviewed and are negative.        PMHx    Patient Active Problem List   Diagnosis    Essential (primary) hypertension    Mixed hyperlipidemia    Subclinical hypothyroidism    Submandibular swelling    GERD  (gastroesophageal reflux disease)    Prostate cancer (HCC)       Prior to Admission medications    Medication Sig Start Date End Date Taking? Authorizing Provider   abiraterone acetate (ZYTIGA) 250 MG tablet Take 4 tablets every day by oral route on empty stomach 1 hr before or 2 hrs after a meal. for 30 days. 01/02/24  Yes [provider]   ciprofloxacin (CIPRO) 250 MG tablet Take 1 tablet by mouth 2 times daily 03/09/24 03/12/24 Yes [provider]   tamsulosin (FLOMAX) 0.4 MG capsule Take 1 capsule by mouth daily 03/09/24 09/05/24 Yes [provider]   predniSONE (DELTASONE) 5 MG tablet Take 1 tablet by mouth 2 times daily   Yes [provider]   rosuvastatin  (CRESTOR ) 10 MG tablet TAKE 1 TABLET BY MOUTH EVERY DAY 03/07/24  Yes Drewey Begue, Hoy SAUNDERS, MD   omeprazole  (PRILOSEC) 20 MG delayed release capsule TAKE 1 CAPSULE BY MOUTH EVERY DAY 07/04/23  Yes Silva Hoy SAUNDERS, MD   amLODIPine  (NORVASC ) 10 MG tablet TAKE 1 TABLET BY MOUTH EVERY DAY 07/01/23  Yes Silva Hoy SAUNDERS, MD   ibuprofen  (ADVIL ;MOTRIN ) 600 MG tablet Take 1 tablet by mouth 3 times daily as needed for Pain 07/09/22  Yes Jama Rankin ORN, MD   NONFORMULARY VITAMIN A 2400 DAILY   Yes [provider]   Multiple Vitamins-Minerals (CENTRUM SILVER 50+MEN PO) Centrum Silver   Yes [provider]   vitamin D (CHOLECALCIFEROL) 25 MCG (1000 UT) TABS tablet Take 1 tablet by mouth daily   Yes Automatic Reconciliation, Ar   cyanocobalamin 2000 MCG tablet Take 1 tablet by mouth daily   Yes Automatic Reconciliation, Ar   Flaxseed, Linseed, (FLAX SEED OIL) 1000 MG CAPS Take by mouth daily   Yes Automatic Reconciliation, Ar       The following sections were reviewed & updated as appropriate: Problem List, Allergies, PMH, PSH, FH, and SH.      Objective:   BP 130/70   Pulse 83   Temp 97.8 F (36.6 C) (Temporal)   Resp 16   Ht 1.727 m (5' 8)   Wt 92.4 kg (203 lb 9.6 oz)   SpO2 99%   BMI 30.96 kg/m     Physical  Exam  Constitutional:       General: He is not in acute distress.  Eyes:      Extraocular Movements: Extraocular movements intact.   Cardiovascular:  Rate and Rhythm: Normal rate and regular rhythm.      Heart sounds: No murmur heard.  Pulmonary:      Effort: Pulmonary effort is normal. No respiratory distress.   Abdominal:      General: Bowel sounds are normal.      Palpations: Abdomen is soft.   Musculoskeletal:      Right lower leg: No edema.      Left lower leg: No edema.   Neurological:      General: No focal deficit present.      Mental Status: He is alert.   Psychiatric:         Mood and Affect: Mood normal.         Behavior: Behavior normal.          The patient (or guardian, if applicable) and other individuals in attendance with the patient were advised that Artificial Intelligence will be utilized during this visit to record, process the conversation to generate a clinical note, and support improvement of the AI technology. The patient (or guardian, if applicable) and other individuals in attendance at the appointment consented to the use of AI, including the recording.        Hoy JONELLE Medicine, MD

## 2024-03-12 NOTE — Assessment & Plan Note (Signed)
Controlled, cont amlodipine

## 2024-03-12 NOTE — Patient Instructions (Signed)
 Colonoscopy due next summer

## 2024-03-12 NOTE — Assessment & Plan Note (Signed)
Controlled, cont omeprazole

## 2024-03-12 NOTE — Assessment & Plan Note (Addendum)
 Will update TSH and free T4.    Fatigue due to medically induced low T

## 2024-03-12 NOTE — Assessment & Plan Note (Signed)
 Taking crestor  10mg  daily  Update lipid panel to assess control

## 2024-03-12 NOTE — Progress Notes (Signed)
 Chief Complaint   Patient presents with    Annual Exam     eviewed record in preparation for visit and have obtained necessary documentation.    Identified pt with two pt identifiers(name and DOB).      Health Maintenance Due   Topic    Shingles vaccine (1 of 2)    Pneumococcal 50+ years Vaccine (1 of 1 - PCV)    COVID-19 Vaccine (4 - 2024-25 season)    Lipids     Depression Screen     Prostate Specific Antigen (PSA) Screening or Monitoring          Chief Complaint   Patient presents with    Annual Exam        Wt Readings from Last 3 Encounters:   03/12/24 92.4 kg (203 lb 9.6 oz)   12/26/23 88.5 kg (195 lb)   12/22/23 88.5 kg (195 lb)     Temp Readings from Last 3 Encounters:   03/12/24 97.8 F (36.6 C) (Temporal)   03/11/23 97.5 F (36.4 C) (Temporal)   07/19/22 98.9 F (37.2 C) (Oral)     BP Readings from Last 3 Encounters:   03/12/24 130/70   12/26/23 (!) 174/83   12/22/23 (!) 144/84     Pulse Readings from Last 3 Encounters:   03/12/24 83   12/26/23 55   12/22/23 82           Learning Assessment:  :    Failed to redirect to the Timeline version of the REVFS SmartLink.    Depression Screening:  :         No data to display                Fall Risk Assessment:  :    Failed to redirect to the Timeline version of the REVFS SmartLink.    Abuse Screening:  :         No data to display

## 2024-03-20 ENCOUNTER — Encounter

## 2024-03-20 LAB — T4, FREE: T4 Free: 1.19 ng/dL (ref 0.82–1.77)

## 2024-03-20 LAB — TSH: TSH: 8.1 u[IU]/mL — ABNORMAL HIGH (ref 0.450–4.500)

## 2024-03-20 LAB — LIPID PANEL
Cholesterol, Total: 229 mg/dL — ABNORMAL HIGH (ref 100–199)
HDL: 73 mg/dL (ref >39)
LDL Cholesterol: 130 mg/dL — ABNORMAL HIGH (ref 0–99)
Triglycerides: 149 mg/dL (ref 0–149)
VLDL Cholesterol Calculated: 26 mg/dL (ref 5–40)

## 2024-03-20 LAB — HEMOGLOBIN A1C
Estimated Avg Glucose: 108 mg/dL
Hemoglobin A1C: 5.4 % (ref 4.8–5.6)

## 2024-03-20 MED ORDER — AMLODIPINE BESYLATE 10 MG PO TABS
10 | ORAL_TABLET | Freq: Every day | ORAL | 3 refills | Status: AC
Start: 2024-03-20 — End: ?

## 2024-03-20 MED ORDER — OMEPRAZOLE 20 MG PO CPDR
20 | ORAL_CAPSULE | Freq: Every day | ORAL | 3 refills | Status: AC
Start: 2024-03-20 — End: ?

## 2024-03-20 NOTE — Telephone Encounter (Signed)
 Pt last seen on 03/12/2024. Due to return in one year.    Visit date not found.    Rx last filled on:  07/01/23 Amlodipine  #90/2RF.  07/04/23 Omeprazole  #90/2RF    Will forward to provider for refill.

## 2024-03-21 ENCOUNTER — Inpatient Hospital Stay
Admit: 2024-03-21 | Discharge: 2024-03-21 | Payer: BLUE CROSS/BLUE SHIELD | Attending: Student in an Organized Health Care Education/Training Program | Primary: Student in an Organized Health Care Education/Training Program

## 2024-03-21 LAB — BASIC METABOLIC PANEL
BUN/Creatinine Ratio: 24 (ref 10–24)
BUN: 20 mg/dL (ref 8–27)
CO2: 22 mmol/L (ref 20–29)
Calcium: 10 mg/dL (ref 8.6–10.2)
Chloride: 105 mmol/L (ref 96–106)
Creatinine: 0.82 mg/dL (ref 0.76–1.27)
Est, Glom Filt Rate: 98 mL/min/1.73 (ref >59)
Glucose: 83 mg/dL (ref 70–99)
Potassium: 5.2 mmol/L (ref 3.5–5.2)
Sodium: 143 mmol/L (ref 134–144)

## 2024-03-21 LAB — SPECIMEN STATUS REPORT

## 2024-03-23 ENCOUNTER — Ambulatory Visit
Payer: BLUE CROSS/BLUE SHIELD | Attending: Student in an Organized Health Care Education/Training Program | Primary: Student in an Organized Health Care Education/Training Program

## 2024-03-25 MED ORDER — LEVOTHYROXINE SODIUM 25 MCG PO TABS
25 | ORAL_TABLET | Freq: Every day | ORAL | 1 refills | 90.00000 days | Status: DC
Start: 2024-03-25 — End: 2024-09-17

## 2024-03-25 MED ORDER — ROSUVASTATIN CALCIUM 20 MG PO TABS
20 | ORAL_TABLET | Freq: Every day | ORAL | 3 refills | 30.00000 days | Status: AC
Start: 2024-03-25 — End: ?

## 2024-03-25 NOTE — Other (Signed)
 Please call  - the body is asking for more thyroid hormone. I will have him start a lose dose of thyroid hormone (levothyroxine ). It should be be taken first thing in the morning on an empty stomach with a sip of water  and he needs to wait 30-60 minutes before eating or drinking anything else.   - I have ordered repeat thyroid labs to be done in 6 weeks to see how he is doing on the new dose.   - I do not see any improvement in the cholesterol levels again so I will increase crestor  to 20 mg. If this dose is not effective then we will try a different medication  - blood sugars running in the normal range, no diabetes or prediabetes

## 2024-04-02 ENCOUNTER — Inpatient Hospital Stay
Admit: 2024-04-02 | Discharge: 2024-04-02 | Payer: BLUE CROSS/BLUE SHIELD | Primary: Student in an Organized Health Care Education/Training Program

## 2024-04-02 LAB — RAD ONC ARIA SESSION SUMMARY
Course Elapsed Days: 0
Plan Fractions Treated to Date: 1
Plan Prescribed Dose Per Fraction: 2.2 Gy
Plan Total Fractions Prescribed: 23
Plan Total Prescribed Dose: 5060 cGy
Reference Point Dosage Given to Date: 2 Gy
Reference Point Dosage Given to Date: 2.2 Gy
Reference Point Dosage Given to Date: 2.2382 Gy
Reference Point Session Dosage Given: 2 Gy
Reference Point Session Dosage Given: 2.2 Gy
Reference Point Session Dosage Given: 2.2382 Gy
Session Number: 1

## 2024-04-03 ENCOUNTER — Inpatient Hospital Stay
Admit: 2024-04-03 | Discharge: 2024-04-03 | Payer: BLUE CROSS/BLUE SHIELD | Primary: Student in an Organized Health Care Education/Training Program

## 2024-04-03 LAB — RAD ONC ARIA SESSION SUMMARY
Course Elapsed Days: 1
Plan Fractions Treated to Date: 2
Plan Prescribed Dose Per Fraction: 2.2 Gy
Plan Total Fractions Prescribed: 23
Plan Total Prescribed Dose: 5060 cGy
Reference Point Dosage Given to Date: 4 Gy
Reference Point Dosage Given to Date: 4.4 Gy
Reference Point Dosage Given to Date: 4.4763 Gy
Reference Point Session Dosage Given: 2 Gy
Reference Point Session Dosage Given: 2.2 Gy
Reference Point Session Dosage Given: 2.2382 Gy
Session Number: 2

## 2024-04-04 ENCOUNTER — Inpatient Hospital Stay
Admit: 2024-04-04 | Discharge: 2024-04-04 | Payer: BLUE CROSS/BLUE SHIELD | Primary: Student in an Organized Health Care Education/Training Program

## 2024-04-04 LAB — RAD ONC ARIA SESSION SUMMARY
Course Elapsed Days: 2
Plan Fractions Treated to Date: 3
Plan Prescribed Dose Per Fraction: 2.2 Gy
Plan Total Fractions Prescribed: 23
Plan Total Prescribed Dose: 5060 cGy
Reference Point Dosage Given to Date: 6 Gy
Reference Point Dosage Given to Date: 6.6 Gy
Reference Point Dosage Given to Date: 6.7145 Gy
Reference Point Session Dosage Given: 2 Gy
Reference Point Session Dosage Given: 2.2 Gy
Reference Point Session Dosage Given: 2.2382 Gy
Session Number: 3

## 2024-04-05 ENCOUNTER — Inpatient Hospital Stay
Admit: 2024-04-05 | Discharge: 2024-04-05 | Payer: BLUE CROSS/BLUE SHIELD | Primary: Student in an Organized Health Care Education/Training Program

## 2024-04-05 LAB — RAD ONC ARIA SESSION SUMMARY
Course Elapsed Days: 3
Plan Fractions Treated to Date: 4
Plan Prescribed Dose Per Fraction: 2.2 Gy
Plan Total Fractions Prescribed: 23
Plan Total Prescribed Dose: 5060 cGy
Reference Point Dosage Given to Date: 8 Gy
Reference Point Dosage Given to Date: 8.8 Gy
Reference Point Dosage Given to Date: 8.9527 Gy
Reference Point Session Dosage Given: 2 Gy
Reference Point Session Dosage Given: 2.2 Gy
Reference Point Session Dosage Given: 2.2382 Gy
Session Number: 4

## 2024-04-05 NOTE — Unmapped (Signed)
 [facility]  [facility]    Radiation Oncology Weekly Progress Note    Encounter Date: 04/05/24     Jay Lee  [id number]  [birth year]     Care Team:  Dr. Kara  Dr. Delano    Diagnosis   C61     The patient is a 63 y.o. 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 32cc Gland. On ADT/ARPI s/p HDR boost 03/09/24 now  undergoing EBRT to the pelvis/prostate/SV.     AJCC Staging has been reviewed - overall stage IIIC  Oncologic History     The patient has a past medical history of HTN, HLD, GERD  2023 - PSA 2.6 ng/mL (routine checked by his PCP)  02/2023 - PSA 6 ng/mL which prompted further workup  06/29/23 - MRI Prostate. 10mm PIRADS 4 lesion, right base PZ. 12mm PIRADS 4 lesion right apex PZ, posteior border with possible ECE. 8mm PIRADS 4 lesion left mid PZ. No apparent SV involvement.  10/06/2023 - Prostate biopsy. High volume high grade prostate cancer. 3 fusion biopsies with GG 5 disease (4+5). 6/12 routine cores with GG3 disease and 6/12 routine cores with GG 5 (4+5) disease. + PNI and cribriform pattern.  11/01/23: PSMA PET - focal PyL binding in right lateral PZ apex to base. Also binding in the right medial SV. No pelvic nodal disease or distant bony or visceral metastatic disease.  11/01/23 - T 309.4  11/28/23 - Lupron 1 mo given  01/02/24 - Started Abi/Pred  03/09/24: HDR upfront boost, 15 Gy, 1 Fx  04/02/24: EBRT started    He opted for upfront HDR boost at [facility] f/b EBRT  Concurrent systemic therapy: ADT + ARPI as noted above    Interval History   Jay Lee is a 63 y.o. male seen today for his weekly on-treatment evaluation    04/05/24: At fraction 4 today. Just getting started. Since HDR reports incomplete emptying. Is taking Flomax 1x per day, 0.4mg . Advised he try to increase the dose to 2 tab each AM to see if that helps. Reviewed potential side effects as RT  progresses. No fatigue, no diarrhea. No other concerns today.        Review of Systems:  A complete review of systems was obtained and negative except as described above.    Treatment Details:     Treatment Site Dose/Fx (cGy) #Fx Current Dose (cGy) Total Planned Dose (cGy) Start Date End Date   Pelvis    Prostate/SV 200    220 4/23 800 4600    5060 04/02/24 ongoing     Allergies and Medications   No Known Allergies    Current Outpatient Medications   Medication Instructions    abiraterone acetate (ZYTIGA) 250 MG tablet Take 4 tablets every day by oral route on empty stomach 1 hr before or 2 hrs after a meal. for 30 days.    amLODIPine  (NORVASC ) 10 mg, Oral, DAILY    cyanocobalamin 2,000 mcg, DAILY    Flaxseed, Linseed, (FLAX SEED OIL) 1000 MG CAPS DAILY    ibuprofen  (ADVIL ;MOTRIN ) 600 mg, Oral, 3 TIMES DAILY PRN    levothyroxine  (SYNTHROID ) 25 mcg, Oral, DAILY BEFORE BREAKFAST, Wait 30-60 minutes before eating    Multiple Vitamins-Minerals (CENTRUM SILVER 50+MEN PO) Centrum Silver    NONFORMULARY VITAMIN A 2400 DAILY    omeprazole  (PRILOSEC) 20 MG delayed release capsule Oral, DAILY  predniSONE (DELTASONE) 5 mg, 2 TIMES DAILY    rosuvastatin  (CRESTOR ) 20 mg, Oral, DAILY    tamsulosin (FLOMAX) 0.4 mg, DAILY    vitamin D (CHOLECALCIFEROL) 1,000 Units, DAILY        Physical Exam:   Vitals: There were no vitals taken for this visit.    Performance Status: ECOG 0, fully active, able to carry on all predisease activities without restrictions  Constitutional: Pleasant, sitting comfortably in no acute distress.  Respiratory: Non-labored breathing  Neurologic: Alert and oriented.  Psychiatric: Cooperative to commands and responds to questions appropriately.  See Interval history above for additional relevant exam findings.    Assessment & Plan   - Continue radiation treatment as planned  - Treatment setup and plan reviewed. Port images/CBCT images reviewed. Appropriate laboratory work was reviewed.   - Treatment side  effects and toxicities reviewed with the patient, and appropriate management was advised as detailed above.     [name], DO  [facility]  [facility]  [address] [place], [place], [place] [address]  P: [phone]  [facility]  [address] [place], [place] [address]  P: [phone]  [facility]  [address], [place], [place] [address]  P: [phone]

## 2024-04-06 ENCOUNTER — Inpatient Hospital Stay
Admit: 2024-04-06 | Discharge: 2024-04-06 | Payer: BLUE CROSS/BLUE SHIELD | Primary: Student in an Organized Health Care Education/Training Program

## 2024-04-06 LAB — RAD ONC ARIA SESSION SUMMARY
Course Elapsed Days: 4
Plan Fractions Treated to Date: 5
Plan Prescribed Dose Per Fraction: 2.2 Gy
Plan Total Fractions Prescribed: 23
Plan Total Prescribed Dose: 5060 cGy
Reference Point Dosage Given to Date: 10 Gy
Reference Point Dosage Given to Date: 11 Gy
Reference Point Dosage Given to Date: 11.1908 Gy
Reference Point Session Dosage Given: 2 Gy
Reference Point Session Dosage Given: 2.2 Gy
Reference Point Session Dosage Given: 2.2382 Gy
Session Number: 5

## 2024-04-09 ENCOUNTER — Inpatient Hospital Stay
Admit: 2024-04-09 | Discharge: 2024-04-09 | Payer: BLUE CROSS/BLUE SHIELD | Primary: Student in an Organized Health Care Education/Training Program

## 2024-04-09 LAB — RAD ONC ARIA SESSION SUMMARY
Course Elapsed Days: 7
Plan Fractions Treated to Date: 6
Plan Prescribed Dose Per Fraction: 2.2 Gy
Plan Total Fractions Prescribed: 23
Plan Total Prescribed Dose: 5060 cGy
Reference Point Dosage Given to Date: 12 Gy
Reference Point Dosage Given to Date: 13.2 Gy
Reference Point Dosage Given to Date: 13.429 Gy
Reference Point Session Dosage Given: 2 Gy
Reference Point Session Dosage Given: 2.2 Gy
Reference Point Session Dosage Given: 2.2382 Gy
Session Number: 6

## 2024-04-10 ENCOUNTER — Inpatient Hospital Stay
Admit: 2024-04-10 | Discharge: 2024-04-10 | Payer: BLUE CROSS/BLUE SHIELD | Primary: Student in an Organized Health Care Education/Training Program

## 2024-04-10 LAB — RAD ONC ARIA SESSION SUMMARY
Course Elapsed Days: 8
Plan Fractions Treated to Date: 7
Plan Prescribed Dose Per Fraction: 2.2 Gy
Plan Total Fractions Prescribed: 23
Plan Total Prescribed Dose: 5060 cGy
Reference Point Dosage Given to Date: 14 Gy
Reference Point Dosage Given to Date: 15.4 Gy
Reference Point Dosage Given to Date: 15.6672 Gy
Reference Point Session Dosage Given: 2 Gy
Reference Point Session Dosage Given: 2.2 Gy
Reference Point Session Dosage Given: 2.2382 Gy
Session Number: 7

## 2024-04-11 ENCOUNTER — Inpatient Hospital Stay
Admit: 2024-04-11 | Discharge: 2024-04-11 | Payer: BLUE CROSS/BLUE SHIELD | Primary: Student in an Organized Health Care Education/Training Program

## 2024-04-11 LAB — RAD ONC ARIA SESSION SUMMARY
Course Elapsed Days: 9
Plan Fractions Treated to Date: 8
Plan Prescribed Dose Per Fraction: 2.2 Gy
Plan Total Fractions Prescribed: 23
Plan Total Prescribed Dose: 5060 cGy
Reference Point Dosage Given to Date: 16 Gy
Reference Point Dosage Given to Date: 17.6 Gy
Reference Point Dosage Given to Date: 17.9054 Gy
Reference Point Session Dosage Given: 2 Gy
Reference Point Session Dosage Given: 2.2 Gy
Reference Point Session Dosage Given: 2.2382 Gy
Session Number: 8

## 2024-04-12 ENCOUNTER — Inpatient Hospital Stay
Admit: 2024-04-12 | Discharge: 2024-04-12 | Payer: BLUE CROSS/BLUE SHIELD | Primary: Student in an Organized Health Care Education/Training Program

## 2024-04-12 ENCOUNTER — Ambulatory Visit: Payer: BLUE CROSS/BLUE SHIELD | Primary: Student in an Organized Health Care Education/Training Program

## 2024-04-12 LAB — RAD ONC ARIA SESSION SUMMARY
Course Elapsed Days: 10
Plan Fractions Treated to Date: 9
Plan Prescribed Dose Per Fraction: 2.2 Gy
Plan Total Fractions Prescribed: 23
Plan Total Prescribed Dose: 5060 cGy
Reference Point Dosage Given to Date: 18 Gy
Reference Point Dosage Given to Date: 19.8 Gy
Reference Point Dosage Given to Date: 20.1435 Gy
Reference Point Session Dosage Given: 2 Gy
Reference Point Session Dosage Given: 2.2 Gy
Reference Point Session Dosage Given: 2.2382 Gy
Session Number: 9

## 2024-04-13 ENCOUNTER — Inpatient Hospital Stay
Admit: 2024-04-13 | Discharge: 2024-04-13 | Payer: BLUE CROSS/BLUE SHIELD | Primary: Student in an Organized Health Care Education/Training Program

## 2024-04-13 ENCOUNTER — Inpatient Hospital Stay
Admit: 2024-04-13 | Payer: BLUE CROSS/BLUE SHIELD | Primary: Student in an Organized Health Care Education/Training Program

## 2024-04-13 LAB — RAD ONC ARIA SESSION SUMMARY
Course Elapsed Days: 11
Plan Fractions Treated to Date: 10
Plan Prescribed Dose Per Fraction: 2.2 Gy
Plan Total Fractions Prescribed: 23
Plan Total Prescribed Dose: 5060 cGy
Reference Point Dosage Given to Date: 20 Gy
Reference Point Dosage Given to Date: 22 Gy
Reference Point Dosage Given to Date: 22.3817 Gy
Reference Point Session Dosage Given: 2 Gy
Reference Point Session Dosage Given: 2.2 Gy
Reference Point Session Dosage Given: 2.2382 Gy
Session Number: 10

## 2024-04-13 NOTE — Unmapped (Signed)
 [facility]  [facility]    Radiation Oncology Weekly Progress Note    Encounter Date: 04/13/24     Jay Lee  [id number]  1961/02/08     Care Team:  Dr. Kara  Dr. Delano    Diagnosis   C61     The patient is a 63 y.o. 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 32cc Gland. On ADT/ARPI s/p HDR boost 03/09/24 now  undergoing EBRT to the pelvis/prostate/SV.     AJCC Staging has been reviewed - overall stage IIIC  Oncologic History     The patient has a past medical history of HTN, HLD, GERD  2023 - PSA 2.6 ng/mL (routine checked by his PCP)  02/2023 - PSA 6 ng/mL which prompted further workup  06/29/23 - MRI Prostate. 10mm PIRADS 4 lesion, right base PZ. 12mm PIRADS 4 lesion right apex PZ, posteior border with possible ECE. 8mm PIRADS 4 lesion left mid PZ. No apparent SV involvement.  10/06/2023 - Prostate biopsy. High volume high grade prostate cancer. 3 fusion biopsies with GG 5 disease (4+5). 6/12 routine cores with GG3 disease and 6/12 routine cores with GG 5 (4+5) disease. + PNI and cribriform pattern.  11/01/23: PSMA PET - focal PyL binding in right lateral PZ apex to base. Also binding in the right medial SV. No pelvic nodal disease or distant bony or visceral metastatic disease.  11/01/23 - T 309.4  11/28/23 - Lupron 1 mo given  01/02/24 - Started Abi/Pred  03/09/24: HDR upfront boost, 15 Gy, 1 Fx  04/02/24: EBRT started    He opted for upfront HDR boost at [facility] f/b EBRT  Concurrent systemic therapy: ADT + ARPI as noted above    Interval History   Jay Lee is a 63 y.o. male seen today for his weekly on-treatment evaluation    04/05/24: At fraction 4 today. Just getting started. Since HDR reports incomplete emptying. Is taking Flomax 1x per day, 0.4mg . Advised he try to increase the dose to 2 tab each AM to see if that helps. Reviewed potential side effects as RT  progresses. No fatigue, no diarrhea. No other concerns today.  04/13/24: At fraction 10, doing well. No acute GI or GU changes compared to last week. Had penile pain x1 this week but lasted only a few seconds and was not extreme pain. Will monitor. No other concerns.       Review of Systems:  A complete review of systems was obtained and negative except as described above.    Treatment Details:     Treatment Site Dose/Fx (cGy) #Fx Current Dose (cGy) Total Planned Dose (cGy) Start Date End Date   Pelvis    Prostate/SV 200    220 10/23 2000 4600    5060 04/02/24 ongoing     Allergies and Medications   No Known Allergies    Current Outpatient Medications   Medication Instructions    abiraterone acetate (ZYTIGA) 250 MG tablet Take 4 tablets every day by oral route on empty stomach 1 hr before or 2 hrs after a meal. for 30 days.    amLODIPine  (NORVASC ) 10 mg, Oral, DAILY    cyanocobalamin 2,000 mcg, DAILY    Flaxseed, Linseed, (FLAX SEED OIL) 1000 MG CAPS DAILY    ibuprofen  (ADVIL ;MOTRIN ) 600 mg, Oral, 3 TIMES DAILY PRN    levothyroxine  (SYNTHROID ) 25 mcg, Oral, DAILY  BEFORE BREAKFAST, Wait 30-60 minutes before eating    Multiple Vitamins-Minerals (CENTRUM SILVER 50+MEN PO) Centrum Silver    NONFORMULARY VITAMIN A 2400 DAILY    omeprazole  (PRILOSEC) 20 MG delayed release capsule Oral, DAILY    predniSONE (DELTASONE) 5 mg, 2 TIMES DAILY    rosuvastatin  (CRESTOR ) 20 mg, Oral, DAILY    tamsulosin (FLOMAX) 0.4 mg, DAILY    vitamin D (CHOLECALCIFEROL) 1,000 Units, DAILY        Physical Exam:   Vitals: There were no vitals taken for this visit. Pain Level: 1  Performance Status: ECOG 0, fully active, able to carry on all predisease activities without restrictions  Constitutional: Pleasant, sitting comfortably in no acute distress.  Respiratory: Non-labored breathing  Neurologic: Alert and oriented.  Psychiatric: Cooperative to commands and responds to questions appropriately.  See Interval history above for additional relevant  exam findings.    Assessment & Plan   - Continue radiation treatment as planned  - Treatment setup and plan reviewed. Port images/CBCT images reviewed. Appropriate laboratory work was reviewed.   - Treatment side effects and toxicities reviewed with the patient, and appropriate management was advised as detailed above.     [name], DO  [facility]  [facility]  [address] [place], [place], [place] [address]  P: [phone]  [facility]  [address], [place], [place] [address]  P: [phone]  [facility]  [address], [address], [place], [place] [address]  P: [phone]

## 2024-04-16 ENCOUNTER — Inpatient Hospital Stay
Admit: 2024-04-16 | Discharge: 2024-04-16 | Payer: BLUE CROSS/BLUE SHIELD | Primary: Student in an Organized Health Care Education/Training Program

## 2024-04-16 LAB — RAD ONC ARIA SESSION SUMMARY
Course Elapsed Days: 14
Plan Fractions Treated to Date: 11
Plan Prescribed Dose Per Fraction: 2.2 Gy
Plan Total Fractions Prescribed: 23
Plan Total Prescribed Dose: 5060 cGy
Reference Point Dosage Given to Date: 22 Gy
Reference Point Dosage Given to Date: 24.2 Gy
Reference Point Dosage Given to Date: 24.6199 Gy
Reference Point Session Dosage Given: 2 Gy
Reference Point Session Dosage Given: 2.2 Gy
Reference Point Session Dosage Given: 2.2382 Gy
Session Number: 11

## 2024-04-17 ENCOUNTER — Inpatient Hospital Stay
Admit: 2024-04-17 | Discharge: 2024-04-17 | Payer: BLUE CROSS/BLUE SHIELD | Primary: Student in an Organized Health Care Education/Training Program

## 2024-04-17 LAB — RAD ONC ARIA SESSION SUMMARY
Course Elapsed Days: 15
Plan Fractions Treated to Date: 12
Plan Prescribed Dose Per Fraction: 2.2 Gy
Plan Total Fractions Prescribed: 23
Plan Total Prescribed Dose: 5060 cGy
Reference Point Dosage Given to Date: 24 Gy
Reference Point Dosage Given to Date: 26.4 Gy
Reference Point Dosage Given to Date: 26.858 Gy
Reference Point Session Dosage Given: 2 Gy
Reference Point Session Dosage Given: 2.2 Gy
Reference Point Session Dosage Given: 2.2382 Gy
Session Number: 12

## 2024-04-18 ENCOUNTER — Inpatient Hospital Stay
Admit: 2024-04-18 | Discharge: 2024-04-18 | Payer: BLUE CROSS/BLUE SHIELD | Primary: Student in an Organized Health Care Education/Training Program

## 2024-04-18 LAB — RAD ONC ARIA SESSION SUMMARY
Course Elapsed Days: 16
Plan Fractions Treated to Date: 13
Plan Prescribed Dose Per Fraction: 2.2 Gy
Plan Total Fractions Prescribed: 23
Plan Total Prescribed Dose: 5060 cGy
Reference Point Dosage Given to Date: 26 Gy
Reference Point Dosage Given to Date: 28.6 Gy
Reference Point Dosage Given to Date: 29.0962 Gy
Reference Point Session Dosage Given: 2 Gy
Reference Point Session Dosage Given: 2.2 Gy
Reference Point Session Dosage Given: 2.2382 Gy
Session Number: 13

## 2024-04-19 ENCOUNTER — Inpatient Hospital Stay
Admit: 2024-04-19 | Discharge: 2024-04-19 | Payer: BLUE CROSS/BLUE SHIELD | Primary: Student in an Organized Health Care Education/Training Program

## 2024-04-19 LAB — RAD ONC ARIA SESSION SUMMARY
Course Elapsed Days: 17
Plan Fractions Treated to Date: 14
Plan Prescribed Dose Per Fraction: 2.2 Gy
Plan Total Fractions Prescribed: 23
Plan Total Prescribed Dose: 5060 cGy
Reference Point Dosage Given to Date: 28 Gy
Reference Point Dosage Given to Date: 30.8 Gy
Reference Point Dosage Given to Date: 31.3344 Gy
Reference Point Session Dosage Given: 2 Gy
Reference Point Session Dosage Given: 2.2 Gy
Reference Point Session Dosage Given: 2.2382 Gy
Session Number: 14

## 2024-04-19 NOTE — Unmapped (Signed)
 [facility]  [facility]    Radiation Oncology Weekly Progress Note    Encounter Date: 04/19/24     Jay Lee  [id number]  1960/12/24     Care Team:  Dr. Kara  Dr. Delano    Diagnosis   C61     The patient is a 63 y.o. 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 32cc Gland. On ADT/ARPI s/p HDR boost 03/09/24 now  undergoing EBRT to the pelvis/prostate/SV.     AJCC Staging has been reviewed - overall stage IIIC  Oncologic History     The patient has a past medical history of HTN, HLD, GERD  2023 - PSA 2.6 ng/mL (routine checked by his PCP)  02/2023 - PSA 6 ng/mL which prompted further workup  06/29/23 - MRI Prostate. 10mm PIRADS 4 lesion, right base PZ. 12mm PIRADS 4 lesion right apex PZ, posteior border with possible ECE. 8mm PIRADS 4 lesion left mid PZ. No apparent SV involvement.  10/06/2023 - Prostate biopsy. High volume high grade prostate cancer. 3 fusion biopsies with GG 5 disease (4+5). 6/12 routine cores with GG3 disease and 6/12 routine cores with GG 5 (4+5) disease. + PNI and cribriform pattern.  11/01/23: PSMA PET - focal PyL binding in right lateral PZ apex to base. Also binding in the right medial SV. No pelvic nodal disease or distant bony or visceral metastatic disease.  11/01/23 - T 309.4  11/28/23 - Lupron 1 mo given  01/02/24 - Started Abi/Pred  03/09/24: HDR upfront boost, 15 Gy, 1 Fx  04/02/24: EBRT started    He opted for upfront HDR boost at [facility] f/b EBRT  Concurrent systemic therapy: ADT + ARPI as noted above    Interval History   Jay Lee is a 63 y.o. male seen today for his weekly on-treatment evaluation    04/05/24: At fraction 4 today. Just getting started. Since HDR reports incomplete emptying. Is taking Flomax 1x per day, 0.4mg . Advised he try to increase the dose to 2 tab each AM to see if that helps. Reviewed potential side effects as RT  progresses. No fatigue, no diarrhea. No other concerns today.  04/13/24: At fraction 10, doing well. No acute GI or GU changes compared to last week. Had penile pain x1 this week but lasted only a few seconds and was not extreme pain. Will monitor. No other concerns.   04/19/24: At fraction 14. Noticing burning in the penis/urethra not only with urination but around the clock. Pain is 3/10 at worst. Discussed trial of AZO and OTC Ibuprofen . Also with rectal/anal discomfort, no blood in stool. Not constipated/straining with Bms. He will try PrepH and if that doesn't help would consider steroid suppositories. Energy stable. No other concerns today.      Review of Systems:  A complete review of systems was obtained and negative except as described above.    Treatment Details:     Treatment Site Dose/Fx (cGy) #Fx Current Dose (cGy) Total Planned Dose (cGy) Start Date End Date   Pelvis    Prostate/SV 200    220 14/23 2800 4600    5060 04/02/24 ongoing     Allergies and Medications   No Known Allergies    Current Outpatient Medications   Medication Instructions    abiraterone acetate (ZYTIGA) 250 MG tablet Take 4 tablets every day by oral route on empty stomach  1 hr before or 2 hrs after a meal. for 30 days.    amLODIPine  (NORVASC ) 10 mg, Oral, DAILY    cyanocobalamin 2,000 mcg, DAILY    Flaxseed, Linseed, (FLAX SEED OIL) 1000 MG CAPS DAILY    ibuprofen  (ADVIL ;MOTRIN ) 600 mg, Oral, 3 TIMES DAILY PRN    levothyroxine  (SYNTHROID ) 25 mcg, Oral, DAILY BEFORE BREAKFAST, Wait 30-60 minutes before eating    Multiple Vitamins-Minerals (CENTRUM SILVER 50+MEN PO) Centrum Silver    NONFORMULARY VITAMIN A 2400 DAILY    omeprazole  (PRILOSEC) 20 MG delayed release capsule Oral, DAILY    predniSONE (DELTASONE) 5 mg, 2 TIMES DAILY    rosuvastatin  (CRESTOR ) 20 mg, Oral, DAILY    tamsulosin (FLOMAX) 0.4 mg, DAILY    vitamin D (CHOLECALCIFEROL) 1,000 Units, DAILY        Physical Exam:   Vitals: There were no vitals taken for this visit.     Performance Status: ECOG 0, fully active, able to carry on all predisease activities without restrictions  Constitutional: Pleasant, sitting comfortably in no acute distress.  Respiratory: Non-labored breathing  Neurologic: Alert and oriented.  Psychiatric: Cooperative to commands and responds to questions appropriately.  See Interval history above for additional relevant exam findings.    Assessment & Plan   - Continue radiation treatment as planned  - Treatment setup and plan reviewed. Port images/CBCT images reviewed. Appropriate laboratory work was reviewed.   - Treatment side effects and toxicities reviewed with the patient, and appropriate management was advised as detailed above.     Jay JULIANNA Loth, DO  [facility]  [facility]  [address] [place], [place], [place] [address]  P: [phone]  [facility]  [address], [place], [place] [address]  P: [phone]  [facility]  [address], [address], [place], [place] [address]  P: [phone]

## 2024-04-20 ENCOUNTER — Ambulatory Visit: Payer: BLUE CROSS/BLUE SHIELD | Primary: Student in an Organized Health Care Education/Training Program

## 2024-04-23 ENCOUNTER — Inpatient Hospital Stay
Admit: 2024-04-23 | Discharge: 2024-04-23 | Payer: BLUE CROSS/BLUE SHIELD | Primary: Student in an Organized Health Care Education/Training Program

## 2024-04-23 LAB — RAD ONC ARIA SESSION SUMMARY
Course Elapsed Days: 21
Plan Fractions Treated to Date: 15
Plan Prescribed Dose Per Fraction: 2.2 Gy
Plan Total Fractions Prescribed: 23
Plan Total Prescribed Dose: 5060 cGy
Reference Point Dosage Given to Date: 30 Gy
Reference Point Dosage Given to Date: 33 Gy
Reference Point Dosage Given to Date: 33.5725 Gy
Reference Point Session Dosage Given: 2 Gy
Reference Point Session Dosage Given: 2.2 Gy
Reference Point Session Dosage Given: 2.2382 Gy
Session Number: 15

## 2024-04-24 ENCOUNTER — Inpatient Hospital Stay
Admit: 2024-04-24 | Discharge: 2024-04-24 | Payer: BLUE CROSS/BLUE SHIELD | Primary: Student in an Organized Health Care Education/Training Program

## 2024-04-24 LAB — RAD ONC ARIA SESSION SUMMARY
Course Elapsed Days: 22
Plan Fractions Treated to Date: 16
Plan Prescribed Dose Per Fraction: 2.2 Gy
Plan Total Fractions Prescribed: 23
Plan Total Prescribed Dose: 5060 cGy
Reference Point Dosage Given to Date: 32 Gy
Reference Point Dosage Given to Date: 35.2 Gy
Reference Point Dosage Given to Date: 35.8107 Gy
Reference Point Session Dosage Given: 2 Gy
Reference Point Session Dosage Given: 2.2 Gy
Reference Point Session Dosage Given: 2.2382 Gy
Session Number: 16

## 2024-04-25 ENCOUNTER — Inpatient Hospital Stay
Admit: 2024-04-25 | Discharge: 2024-04-25 | Payer: BLUE CROSS/BLUE SHIELD | Primary: Student in an Organized Health Care Education/Training Program

## 2024-04-25 LAB — RAD ONC ARIA SESSION SUMMARY
Course Elapsed Days: 23
Plan Fractions Treated to Date: 17
Plan Prescribed Dose Per Fraction: 2.2 Gy
Plan Total Fractions Prescribed: 23
Plan Total Prescribed Dose: 5060 cGy
Reference Point Dosage Given to Date: 34 Gy
Reference Point Dosage Given to Date: 37.4 Gy
Reference Point Dosage Given to Date: 38.0489 Gy
Reference Point Session Dosage Given: 2 Gy
Reference Point Session Dosage Given: 2.2 Gy
Reference Point Session Dosage Given: 2.2382 Gy
Session Number: 17

## 2024-04-26 ENCOUNTER — Inpatient Hospital Stay
Admit: 2024-04-26 | Discharge: 2024-04-26 | Payer: BLUE CROSS/BLUE SHIELD | Primary: Student in an Organized Health Care Education/Training Program

## 2024-04-26 ENCOUNTER — Inpatient Hospital Stay
Admit: 2024-04-26 | Discharge: 2024-04-26 | Payer: BLUE CROSS/BLUE SHIELD | Attending: Radiation Oncology | Primary: Student in an Organized Health Care Education/Training Program

## 2024-04-26 DIAGNOSIS — C61 Malignant neoplasm of prostate: Principal | ICD-10-CM

## 2024-04-26 LAB — RAD ONC ARIA SESSION SUMMARY
Course Elapsed Days: 24
Plan Fractions Treated to Date: 18
Plan Prescribed Dose Per Fraction: 2.2 Gy
Plan Total Fractions Prescribed: 23
Plan Total Prescribed Dose: 5060 cGy
Reference Point Dosage Given to Date: 36 Gy
Reference Point Dosage Given to Date: 39.6 Gy
Reference Point Dosage Given to Date: 40.2871 Gy
Reference Point Session Dosage Given: 2 Gy
Reference Point Session Dosage Given: 2.2 Gy
Reference Point Session Dosage Given: 2.2382 Gy
Session Number: 18

## 2024-04-26 NOTE — Unmapped (Signed)
 [facility]  [facility]    Radiation Oncology Weekly Progress Note    Encounter Date: 04/26/24     Jay Lee  [id number]  1961/08/08     Care Team:  Dr. Kara  Dr. Delano    Diagnosis   C61     The patient is a 63 y.o. 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 32cc Gland. On ADT/ARPI s/p HDR boost 03/09/24 now  undergoing EBRT to the pelvis/prostate/SV.     AJCC Staging has been reviewed - overall stage IIIC  Oncologic History     The patient has a past medical history of HTN, HLD, GERD  2023 - PSA 2.6 ng/mL (routine checked by his PCP)  02/2023 - PSA 6 ng/mL which prompted further workup  06/29/23 - MRI Prostate. 10mm PIRADS 4 lesion, right base PZ. 12mm PIRADS 4 lesion right apex PZ, posteior border with possible ECE. 8mm PIRADS 4 lesion left mid PZ. No apparent SV involvement.  10/06/2023 - Prostate biopsy. High volume high grade prostate cancer. 3 fusion biopsies with GG 5 disease (4+5). 6/12 routine cores with GG3 disease and 6/12 routine cores with GG 5 (4+5) disease. + PNI and cribriform pattern.  11/01/23: PSMA PET - focal PyL binding in right lateral PZ apex to base. Also binding in the right medial SV. No pelvic nodal disease or distant bony or visceral metastatic disease.  11/01/23 - T 309.4  11/28/23 - Lupron 1 mo given  01/02/24 - Started Abi/Pred  03/09/24: HDR upfront boost, 15 Gy, 1 Fx  04/02/24: EBRT started    He opted for upfront HDR boost at [facility] f/b EBRT  Concurrent systemic therapy: ADT + ARPI as noted above    Interval History   Mr. Jay Lee is a 63 y.o. male seen today for his weekly on-treatment evaluation    04/05/24: At fraction 4 today. Just getting started. Since HDR reports incomplete emptying. Is taking Flomax 1x per day, 0.4mg . Advised he try to increase the dose to 2 tab each AM to see if that helps. Reviewed potential side effects as RT  progresses. No fatigue, no diarrhea. No other concerns today.  04/13/24: At fraction 10, doing well. No acute GI or GU changes compared to last week. Had penile pain x1 this week but lasted only a few seconds and was not extreme pain. Will monitor. No other concerns.   04/19/24: At fraction 14. Noticing burning in the penis/urethra not only with urination but around the clock. Pain is 3/10 at worst. Discussed trial of AZO and OTC Ibuprofen . Also with rectal/anal discomfort, no blood in stool. Not constipated/straining with Bms. He will try PrepH and if that doesn't help would consider steroid suppositories. Energy stable. No other concerns today.  04/26/2024: Doing well today.  Taking tamsulosin 2 pills every other day, alternating with single pill daily.  He has found that this works well for him.  Has not needed to take ibuprofen  or phenazopyridine.  Denies GI complaints.  Had some short lasting partial morning erections today and yesterday, associated with some penile discomfort.  I told him we should keep an eye on this but I do not think it is any particular red flag or anything.  All questions answered.      Review of Systems:  A complete review of systems was obtained and negative except as described above.  Treatment Details:     Treatment Site Dose/Fx (cGy) #Fx Current Dose (cGy) Total Planned Dose (cGy) Start Date End Date   Pelvis    Prostate/SV 200    220 18/23 3960 4600    5060 04/02/24 ongoing     Allergies and Medications   No Known Allergies    Current Outpatient Medications   Medication Instructions    abiraterone acetate (ZYTIGA) 250 MG tablet Take 4 tablets every day by oral route on empty stomach 1 hr before or 2 hrs after a meal. for 30 days.    amLODIPine  (NORVASC ) 10 mg, Oral, DAILY    cyanocobalamin 2,000 mcg, DAILY    Flaxseed, Linseed, (FLAX SEED OIL) 1000 MG CAPS DAILY    ibuprofen  (ADVIL ;MOTRIN ) 600 mg, Oral, 3 TIMES DAILY PRN    levothyroxine  (SYNTHROID ) 25 mcg, Oral, DAILY BEFORE  BREAKFAST, Wait 30-60 minutes before eating    Multiple Vitamins-Minerals (CENTRUM SILVER 50+MEN PO) Centrum Silver    NONFORMULARY VITAMIN A 2400 DAILY    omeprazole  (PRILOSEC) 20 MG delayed release capsule Oral, DAILY    predniSONE (DELTASONE) 5 mg, 2 TIMES DAILY    rosuvastatin  (CRESTOR ) 20 mg, Oral, DAILY    tamsulosin (FLOMAX) 0.4 mg, DAILY    vitamin D (CHOLECALCIFEROL) 1,000 Units, DAILY        Physical Exam:   Vitals: There were no vitals taken for this visit.    Performance Status: ECOG 0, fully active, able to carry on all predisease activities without restrictions  Constitutional: Pleasant, sitting comfortably in no acute distress.  Respiratory: Non-labored breathing  Neurologic: Alert and oriented.  Psychiatric: Cooperative to commands and responds to questions appropriately.  See Interval history above for additional relevant exam findings.    Assessment & Plan   - Continue radiation treatment as planned  - Treatment setup and plan reviewed. Port images/CBCT images reviewed. Appropriate laboratory work was reviewed.   - Treatment side effects and toxicities reviewed with the patient, and appropriate management was advised as detailed above.     Fairy Barefoot, MD  [facility]  [facility]  [address] [place], [place], [place] [address]  P: [phone]  [facility]  [address] [place], [place] [address]  P: [phone]  [facility]  [address], [place], [place] [address]  P: [phone]

## 2024-04-27 ENCOUNTER — Inpatient Hospital Stay
Admit: 2024-04-27 | Discharge: 2024-04-27 | Payer: BLUE CROSS/BLUE SHIELD | Primary: Student in an Organized Health Care Education/Training Program

## 2024-04-27 LAB — RAD ONC ARIA SESSION SUMMARY
Course Elapsed Days: 25
Plan Fractions Treated to Date: 19
Plan Prescribed Dose Per Fraction: 2.2 Gy
Plan Total Fractions Prescribed: 23
Plan Total Prescribed Dose: 5060 cGy
Reference Point Dosage Given to Date: 38 Gy
Reference Point Dosage Given to Date: 41.8 Gy
Reference Point Dosage Given to Date: 42.5252 Gy
Reference Point Session Dosage Given: 2 Gy
Reference Point Session Dosage Given: 2.2 Gy
Reference Point Session Dosage Given: 2.2382 Gy
Session Number: 19

## 2024-05-01 ENCOUNTER — Inpatient Hospital Stay
Admit: 2024-05-01 | Discharge: 2024-05-01 | Payer: BLUE CROSS/BLUE SHIELD | Primary: Student in an Organized Health Care Education/Training Program

## 2024-05-01 LAB — RAD ONC ARIA SESSION SUMMARY
Course Elapsed Days: 29
Plan Fractions Treated to Date: 20
Plan Prescribed Dose Per Fraction: 2.2 Gy
Plan Total Fractions Prescribed: 23
Plan Total Prescribed Dose: 5060 cGy
Reference Point Dosage Given to Date: 40 Gy
Reference Point Dosage Given to Date: 44 Gy
Reference Point Dosage Given to Date: 44.7634 Gy
Reference Point Session Dosage Given: 2 Gy
Reference Point Session Dosage Given: 2.2 Gy
Reference Point Session Dosage Given: 2.2382 Gy
Session Number: 20

## 2024-05-02 ENCOUNTER — Inpatient Hospital Stay
Admit: 2024-05-02 | Discharge: 2024-05-02 | Payer: BLUE CROSS/BLUE SHIELD | Primary: Student in an Organized Health Care Education/Training Program

## 2024-05-02 LAB — RAD ONC ARIA SESSION SUMMARY
Course Elapsed Days: 30
Plan Fractions Treated to Date: 21
Plan Prescribed Dose Per Fraction: 2.2 Gy
Plan Total Fractions Prescribed: 23
Plan Total Prescribed Dose: 5060 cGy
Reference Point Dosage Given to Date: 42 Gy
Reference Point Dosage Given to Date: 46.2 Gy
Reference Point Dosage Given to Date: 47.0016 Gy
Reference Point Session Dosage Given: 2 Gy
Reference Point Session Dosage Given: 2.2 Gy
Reference Point Session Dosage Given: 2.2382 Gy
Session Number: 21

## 2024-05-03 ENCOUNTER — Inpatient Hospital Stay
Admit: 2024-05-03 | Discharge: 2024-05-03 | Payer: BLUE CROSS/BLUE SHIELD | Primary: Student in an Organized Health Care Education/Training Program

## 2024-05-03 ENCOUNTER — Ambulatory Visit: Payer: BLUE CROSS/BLUE SHIELD | Primary: Student in an Organized Health Care Education/Training Program

## 2024-05-03 LAB — RAD ONC ARIA SESSION SUMMARY
Course Elapsed Days: 31
Plan Fractions Treated to Date: 22
Plan Prescribed Dose Per Fraction: 2.2 Gy
Plan Total Fractions Prescribed: 23
Plan Total Prescribed Dose: 5060 cGy
Reference Point Dosage Given to Date: 44 Gy
Reference Point Dosage Given to Date: 48.4 Gy
Reference Point Dosage Given to Date: 49.2397 Gy
Reference Point Session Dosage Given: 2 Gy
Reference Point Session Dosage Given: 2.2 Gy
Reference Point Session Dosage Given: 2.2382 Gy
Session Number: 22

## 2024-05-03 MED ORDER — TAMSULOSIN HCL 0.4 MG PO CAPS
0.4 | ORAL_CAPSULE | Freq: Two times a day (BID) | ORAL | 3 refills | Status: AC
Start: 2024-05-03 — End: 2025-04-28

## 2024-05-03 NOTE — Unmapped (Addendum)
 [facility]  [facility]    Radiation Oncology Weekly Progress Note    Encounter Date: 05/03/24     Jay Lee  [id number]  03/24/1961     Care Team:  Dr. Kara  Dr. Delano    Diagnosis   C61     The patient is a 64 y.o. 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 32cc Gland. On ADT/ARPI s/p HDR boost 03/09/24 now  undergoing EBRT to the pelvis/prostate/SV.     AJCC Staging has been reviewed - overall stage IIIC  Oncologic History     The patient has a past medical history of HTN, HLD, GERD  2023 - PSA 2.6 ng/mL (routine checked by his PCP)  02/2023 - PSA 6 ng/mL which prompted further workup  06/29/23 - MRI Prostate. 10mm PIRADS 4 lesion, right base PZ. 12mm PIRADS 4 lesion right apex PZ, posteior border with possible ECE. 8mm PIRADS 4 lesion left mid PZ. No apparent SV involvement.  10/06/2023 - Prostate biopsy. High volume high grade prostate cancer. 3 fusion biopsies with GG 5 disease (4+5). 6/12 routine cores with GG3 disease and 6/12 routine cores with GG 5 (4+5) disease. + PNI and cribriform pattern.  11/01/23: PSMA PET - focal PyL binding in right lateral PZ apex to base. Also binding in the right medial SV. No pelvic nodal disease or distant bony or visceral metastatic disease.  11/01/23 - T 309.4  11/28/23 - Lupron 1 mo given  01/02/24 - Started Abi/Pred  03/09/24: HDR upfront boost, 15 Gy, 1 Fx  04/02/24: EBRT started    He opted for upfront HDR boost at [facility] f/b EBRT  Concurrent systemic therapy: ADT + ARPI as noted above    Interval History   Mr. Jay Lee is a 63 y.o. male seen today for his weekly on-treatment evaluation    04/05/24: At fraction 4 today. Just getting started. Since HDR reports incomplete emptying. Is taking Flomax  1x per day, 0.4mg . Advised he try to increase the dose to 2 tab each AM to see if that helps. Reviewed potential side effects as RT  progresses. No fatigue, no diarrhea. No other concerns today.  04/13/24: At fraction 10, doing well. No acute GI or GU changes compared to last week. Had penile pain x1 this week but lasted only a few seconds and was not extreme pain. Will monitor. No other concerns.   04/19/24: At fraction 14. Noticing burning in the penis/urethra not only with urination but around the clock. Pain is 3/10 at worst. Discussed trial of AZO and OTC Ibuprofen . Also with rectal/anal discomfort, no blood in stool. Not constipated/straining with Bms. He will try PrepH and if that doesn't help would consider steroid suppositories. Energy stable. No other concerns today.  04/26/2024: Doing well today.  Taking tamsulosin  2 pills every other day, alternating with single pill daily.  He has found that this works well for him.  Has not needed to take ibuprofen  or phenazopyridine.  Denies GI complaints.  Had some short lasting partial morning erections today and yesterday, associated with some penile discomfort.  I told him we should keep an eye on this but I do not think it is any particular red flag or anything.  All questions answered.  05/03/24: One fraction left tomorrow. Doing well still, continues to take Flomax  alternating days qD vs BID. Doing well with this. No further penile  pain or discomfort with urination or erections. No diarrhea. Has follow up with Dr. Kara and Dr. Delano upcoming in Sept/Oct. Reviewed due for Lupron ~06/2024. Will see him back 09/2024 or sooner if need be. Flomax  refill sent to preferred pharmacy      Review of Systems:  A complete review of systems was obtained and negative except as described above.    Treatment Details:     Treatment Site Dose/Fx (cGy) #Fx Current Dose (cGy) Total Planned Dose (cGy) Start Date End Date   Pelvis    Prostate/SV 200    220 22/23 4400 4600    5060 04/02/24 ongoing     Allergies and Medications   No Known Allergies    Current Outpatient Medications   Medication Instructions     abiraterone acetate (ZYTIGA) 250 MG tablet Take 4 tablets every day by oral route on empty stomach 1 hr before or 2 hrs after a meal. for 30 days.    amLODIPine  (NORVASC ) 10 mg, Oral, DAILY    cyanocobalamin 2,000 mcg, DAILY    Flaxseed, Linseed, (FLAX SEED OIL) 1000 MG CAPS DAILY    ibuprofen  (ADVIL ;MOTRIN ) 600 mg, Oral, 3 TIMES DAILY PRN    levothyroxine  (SYNTHROID ) 25 mcg, Oral, DAILY BEFORE BREAKFAST, Wait 30-60 minutes before eating    Multiple Vitamins-Minerals (CENTRUM SILVER 50+MEN PO) Centrum Silver    NONFORMULARY VITAMIN A 2400 DAILY    omeprazole  (PRILOSEC) 20 MG delayed release capsule Oral, DAILY    predniSONE (DELTASONE) 5 mg, 2 TIMES DAILY    rosuvastatin  (CRESTOR ) 20 mg, Oral, DAILY    tamsulosin  (FLOMAX ) 0.4 mg, Oral, 2 times daily    vitamin D (CHOLECALCIFEROL) 1,000 Units, DAILY        Physical Exam:   Vitals: There were no vitals taken for this visit.    Performance Status: ECOG 0, fully active, able to carry on all predisease activities without restrictions  Constitutional: Pleasant, sitting comfortably in no acute distress.  Respiratory: Non-labored breathing  Neurologic: Alert and oriented.  Psychiatric: Cooperative to commands and responds to questions appropriately.  See Interval history above for additional relevant exam findings.    Assessment & Plan   - Continue radiation treatment as planned  - Treatment setup and plan reviewed. Port images/CBCT images reviewed. Appropriate laboratory work was reviewed.   - Treatment side effects and toxicities reviewed with the patient, and appropriate management was advised as detailed above.     Jay JULIANNA Loth, DO  [facility]  [facility]  [address] [place], [place], [place] [address]  P: [phone]  [facility]  [address] [place], [place] [address]  P: [phone]  [facility]  [address], [address], [place], [place] [address]  P: [phone]

## 2024-05-04 ENCOUNTER — Inpatient Hospital Stay
Admit: 2024-05-04 | Discharge: 2024-05-04 | Payer: BLUE CROSS/BLUE SHIELD | Primary: Student in an Organized Health Care Education/Training Program

## 2024-05-04 LAB — RAD ONC ARIA SESSION SUMMARY
Course Elapsed Days: 32
Plan Fractions Treated to Date: 23
Plan Prescribed Dose Per Fraction: 2.2 Gy
Plan Total Fractions Prescribed: 23
Plan Total Prescribed Dose: 5060 cGy
Reference Point Dosage Given to Date: 46 Gy
Reference Point Dosage Given to Date: 50.6 Gy
Reference Point Dosage Given to Date: 51.4779 Gy
Reference Point Session Dosage Given: 2 Gy
Reference Point Session Dosage Given: 2.2 Gy
Reference Point Session Dosage Given: 2.2382 Gy
Session Number: 23

## 2024-05-07 NOTE — Progress Notes (Signed)
 [facility]  [facility]    Radiation Oncology End of Treatment Summary    SHELLEY POOLEY  [id number]  12-Oct-1960     Care Team:  Dr. Kara  Dr. Delano    Diagnosis   C61    The patient is a 63 y.o. male with a history of NCCN very high risk prostate cancer (cT3bN0M0, iPSA 6 ng/mL, Gleason GG5 disease, high volume, +PNI/Cribriform). 32cc Gland. On ADT/ARPI s/p HDR boost 03/09/24 now completed EBRT to the pelvis/prostate/SV.     AJCC Staging Reviewed  Oncologic History     The patient has a past medical history of HTN, HLD, GERD  2023 - PSA 2.6 ng/mL (routine checked by his PCP)  02/2023 - PSA 6 ng/mL which prompted further workup  06/29/23 - MRI Prostate. 10mm PIRADS 4 lesion, right base PZ. 12mm PIRADS 4 lesion right apex PZ, posteior border with possible ECE. 8mm PIRADS 4 lesion left mid PZ. No apparent SV involvement.  10/06/2023 - Prostate biopsy. High volume high grade prostate cancer. 3 fusion biopsies with GG 5 disease (4+5). 6/12 routine cores with GG3 disease and 6/12 routine cores with GG 5 (4+5) disease. + PNI and cribriform pattern.  11/01/23: PSMA PET - focal PyL binding in right lateral PZ apex to base. Also binding in the right medial SV. No pelvic nodal disease or distant bony or visceral metastatic disease.  11/01/23 - T 309.4  11/28/23 - Lupron 1 mo given  01/02/24 - Started Abi/Pred  03/09/24: HDR upfront boost, 15 Gy, 1 Fx  04/02/24 -      He opted for upfront HDR boost at [facility] f/b EBRT  Concurrent systemic therapy: ADT + ARPI as noted above    Treatment Details:     Treating [facility]  Treatment Technique: VMAT/IMRT    Treatment Details:   Treatment Site Dose/Fx (cGy) #Fx Delivered Dose (cGy) Start Date End Date Elapsed Days   Pelvis    Prostate/SV 200    220 23/23 4600    5060 04/02/24 05/04/24 32     Concurrent Therapy: Concurrent ADT:    Radiation Treatments       Active   Reference Points   [id number]    Most recent treatment: Dose given: 220 cGy (on 05/04/2024)   Total: Dose given: 5,060 cGy   Elapsed Days: 32      [id number]   Most recent treatment: Dose given: 200 cGy (on 05/04/2024)   Total: Dose given: 4,600 cGy   Elapsed Days: 32      [id number]   Most recent treatment: Dose given: 224 cGy (on 05/04/2024)   Total: Dose given: 5,148 cGy   Elapsed Days: 32           Historical   Plans   [id number]   Most recent treatment: Dose planned: 220 cGy (fraction 23 on 05/04/2024)   Total: Dose planned: 5,060 cGy (23 fractions)   Elapsed Days: 32                     Under-treatment Course Summary   He completed radiation without any unexpected side effects to treatment. There were no significant treatment delays or missed treatments during their radiotherapy course.    The patient experienced the following acute toxicities during their radiotherapy course:  Grade 1 fatigue  Grade 2 dysuria/frequency managed with alternating BID and qD Flomax   No Diarrhea during RT  Follow Up Plan   - See Dr. Kara 05/09/24  - See Dr. Delano 06/08/24  - Lupron [facility]/01/[facility] at [facility] with [facility] Kara  - Continue Abi/Pred  - Goal 2 yr total ADT/ARPI, until 12/28/2025  - Follow up with me with labs 10/04/2024 or sooner if needed  - Continue follow up with the remainder of your oncologic team        Thank you for the opportunity to participate in Mr. [place] care.    Asberry Loth, DO  [facility]  [facility]  [address] [place], [place], [place] [address]  P: [phone]  [facility]  [address], [place], [place] [address]  P: [phone]  [facility]  [address], [place], [place] [address]  P: [phone]

## 2024-08-27 ENCOUNTER — Encounter

## 2024-09-16 ENCOUNTER — Encounter

## 2024-09-17 MED ORDER — LEVOTHYROXINE SODIUM 25 MCG PO TABS
25 | ORAL_TABLET | Freq: Every day | ORAL | 1 refills | 90.00000 days | Status: AC
Start: 2024-09-17 — End: ?

## 2024-09-17 NOTE — Telephone Encounter (Signed)
 Pt in compliance w/scheduling f/u appt as recommended by provider. Recent labs done on 03/19/24. Med sent to pharmacy 90 day + 1 refill.

## 2024-09-19 NOTE — Telephone Encounter (Signed)
 02/2024 I asked him to start thyroid medication and repeat lab in 6 weeks. This has not been done. Please call him and ask him to have his lab done now. It does NOT need to be fasting.    Hoy JONELLE Medicine, MD

## 2024-09-20 NOTE — Telephone Encounter (Signed)
 09/20/24 - L/M for Mr. Jay Lee, explaining that Dr. Silva had ordered labs back in July to check on his Thyroid. They have not been done. She is going out on maternity leave soon and would like him to get his labs done as soon as possible. Labs were ordered at [facility] and he does NOT need to be fasting to get them done. SLS

## 2024-09-27 NOTE — Telephone Encounter (Signed)
 Patient advised

## 2024-09-27 NOTE — Telephone Encounter (Signed)
 Silva Hoy SAUNDERS, MD to [facility] End Im Team Three (Selected Message)        09/27/24 10:11 AM  I have ordered different labs that Dr Kara. He needs to have my labs drawn as directed at our last visit. I can not ask other physician to order labs on my behalf as that is the responsibility of each doctor an ensure results are sent to the appropriate doctor after the fact. Labs are ordered to [facility].      Hoy SAUNDERS Silva, MD

## 2024-09-27 NOTE — Telephone Encounter (Signed)
 He just need his TSH done. Is ok for me to called Dr. Kara office and asked if they could draw labs along with their labs.

## 2024-09-27 NOTE — Telephone Encounter (Signed)
 Patient's wife, Adrien, called.  The patient received a [facility] requesting him to have labs done.  He will be getting labs done for his urologist, Dr. Bebe Brasil, on 10/03/24.  She would like to know if we can contact Dr. Benson office [phone]) and let them know which labs Dr. Silva wants him to have and have them add them to their order, so he doesn't have to go to the lab twice.    Halford Goetzke - [phone]

## 2024-09-29 LAB — T4, FREE: T4 Free: 1.43 ng/dL (ref 0.82–1.77)

## 2024-09-29 LAB — TSH: TSH: 3.34 u[IU]/mL (ref 0.450–4.500)

## 2024-10-04 ENCOUNTER — Ambulatory Visit: Payer: BLUE CROSS/BLUE SHIELD | Primary: Student in an Organized Health Care Education/Training Program

## 2024-10-04 NOTE — Result Encounter Note (Signed)
 Will send MCM - TSH WNL - cont levothyroxine  25mcg daily
